# Patient Record
Sex: Male | Born: 1935 | Race: White | Hispanic: No | Marital: Married | Smoking: Former smoker
Health system: Southern US, Community
[De-identification: ages and names within clinical notes are randomized; demographics above are authoritative.]

## PROBLEM LIST (undated history)

## (undated) DIAGNOSIS — J45909 Unspecified asthma, uncomplicated: Secondary | ICD-10-CM

## (undated) DIAGNOSIS — M1990 Unspecified osteoarthritis, unspecified site: Secondary | ICD-10-CM

## (undated) DIAGNOSIS — I82409 Acute embolism and thrombosis of unspecified deep veins of unspecified lower extremity: Secondary | ICD-10-CM

## (undated) DIAGNOSIS — I10 Essential (primary) hypertension: Secondary | ICD-10-CM

## (undated) DIAGNOSIS — E119 Type 2 diabetes mellitus without complications: Secondary | ICD-10-CM

## (undated) DIAGNOSIS — E785 Hyperlipidemia, unspecified: Secondary | ICD-10-CM

## (undated) HISTORY — DX: Hyperlipidemia, unspecified: E78.5

## (undated) HISTORY — DX: Type 2 diabetes mellitus without complications: E11.9

## (undated) HISTORY — DX: Essential (primary) hypertension: I10

## (undated) HISTORY — PX: CHOLECYSTECTOMY: SHX55

## (undated) HISTORY — PX: TRANSURETHRAL RESECTION OF PROSTATE: SHX73

---

## 2020-03-05 DIAGNOSIS — E1129 Type 2 diabetes mellitus with other diabetic kidney complication: Secondary | ICD-10-CM | POA: Diagnosis not present

## 2020-03-05 DIAGNOSIS — R809 Proteinuria, unspecified: Secondary | ICD-10-CM | POA: Diagnosis not present

## 2020-03-05 DIAGNOSIS — I10 Essential (primary) hypertension: Secondary | ICD-10-CM | POA: Diagnosis not present

## 2020-03-05 DIAGNOSIS — E1165 Type 2 diabetes mellitus with hyperglycemia: Secondary | ICD-10-CM | POA: Diagnosis not present

## 2020-03-05 DIAGNOSIS — Z299 Encounter for prophylactic measures, unspecified: Secondary | ICD-10-CM | POA: Diagnosis not present

## 2020-03-05 DIAGNOSIS — Z87891 Personal history of nicotine dependence: Secondary | ICD-10-CM | POA: Diagnosis not present

## 2020-03-10 DIAGNOSIS — E1140 Type 2 diabetes mellitus with diabetic neuropathy, unspecified: Secondary | ICD-10-CM | POA: Diagnosis not present

## 2020-03-10 DIAGNOSIS — M79605 Pain in left leg: Secondary | ICD-10-CM | POA: Diagnosis not present

## 2020-03-10 DIAGNOSIS — E1143 Type 2 diabetes mellitus with diabetic autonomic (poly)neuropathy: Secondary | ICD-10-CM | POA: Diagnosis not present

## 2020-03-10 DIAGNOSIS — M79604 Pain in right leg: Secondary | ICD-10-CM | POA: Diagnosis not present

## 2020-03-25 DIAGNOSIS — I10 Essential (primary) hypertension: Secondary | ICD-10-CM | POA: Diagnosis not present

## 2020-03-25 DIAGNOSIS — E1165 Type 2 diabetes mellitus with hyperglycemia: Secondary | ICD-10-CM | POA: Diagnosis not present

## 2020-03-25 DIAGNOSIS — Z299 Encounter for prophylactic measures, unspecified: Secondary | ICD-10-CM | POA: Diagnosis not present

## 2020-03-25 DIAGNOSIS — W57XXXA Bitten or stung by nonvenomous insect and other nonvenomous arthropods, initial encounter: Secondary | ICD-10-CM | POA: Diagnosis not present

## 2020-04-08 DIAGNOSIS — I82409 Acute embolism and thrombosis of unspecified deep veins of unspecified lower extremity: Secondary | ICD-10-CM | POA: Diagnosis not present

## 2020-04-08 DIAGNOSIS — I10 Essential (primary) hypertension: Secondary | ICD-10-CM | POA: Diagnosis not present

## 2020-04-08 DIAGNOSIS — Z299 Encounter for prophylactic measures, unspecified: Secondary | ICD-10-CM | POA: Diagnosis not present

## 2020-04-08 DIAGNOSIS — E1165 Type 2 diabetes mellitus with hyperglycemia: Secondary | ICD-10-CM | POA: Diagnosis not present

## 2020-04-08 DIAGNOSIS — W57XXXA Bitten or stung by nonvenomous insect and other nonvenomous arthropods, initial encounter: Secondary | ICD-10-CM | POA: Diagnosis not present

## 2020-04-20 DIAGNOSIS — Z299 Encounter for prophylactic measures, unspecified: Secondary | ICD-10-CM | POA: Diagnosis not present

## 2020-04-20 DIAGNOSIS — E1165 Type 2 diabetes mellitus with hyperglycemia: Secondary | ICD-10-CM | POA: Diagnosis not present

## 2020-04-20 DIAGNOSIS — I10 Essential (primary) hypertension: Secondary | ICD-10-CM | POA: Diagnosis not present

## 2020-04-20 DIAGNOSIS — I82409 Acute embolism and thrombosis of unspecified deep veins of unspecified lower extremity: Secondary | ICD-10-CM | POA: Diagnosis not present

## 2020-05-24 DIAGNOSIS — E1165 Type 2 diabetes mellitus with hyperglycemia: Secondary | ICD-10-CM | POA: Diagnosis not present

## 2020-05-24 DIAGNOSIS — E119 Type 2 diabetes mellitus without complications: Secondary | ICD-10-CM | POA: Diagnosis not present

## 2020-06-01 DIAGNOSIS — I82409 Acute embolism and thrombosis of unspecified deep veins of unspecified lower extremity: Secondary | ICD-10-CM | POA: Diagnosis not present

## 2020-06-01 DIAGNOSIS — E1165 Type 2 diabetes mellitus with hyperglycemia: Secondary | ICD-10-CM | POA: Diagnosis not present

## 2020-06-01 DIAGNOSIS — I10 Essential (primary) hypertension: Secondary | ICD-10-CM | POA: Diagnosis not present

## 2020-06-01 DIAGNOSIS — Z299 Encounter for prophylactic measures, unspecified: Secondary | ICD-10-CM | POA: Diagnosis not present

## 2020-06-09 DIAGNOSIS — M79604 Pain in right leg: Secondary | ICD-10-CM | POA: Diagnosis not present

## 2020-06-09 DIAGNOSIS — M79605 Pain in left leg: Secondary | ICD-10-CM | POA: Diagnosis not present

## 2020-06-09 DIAGNOSIS — E1140 Type 2 diabetes mellitus with diabetic neuropathy, unspecified: Secondary | ICD-10-CM | POA: Diagnosis not present

## 2020-06-18 DIAGNOSIS — E1165 Type 2 diabetes mellitus with hyperglycemia: Secondary | ICD-10-CM | POA: Diagnosis not present

## 2020-06-18 DIAGNOSIS — Z299 Encounter for prophylactic measures, unspecified: Secondary | ICD-10-CM | POA: Diagnosis not present

## 2020-06-18 DIAGNOSIS — J069 Acute upper respiratory infection, unspecified: Secondary | ICD-10-CM | POA: Diagnosis not present

## 2020-06-18 DIAGNOSIS — I82409 Acute embolism and thrombosis of unspecified deep veins of unspecified lower extremity: Secondary | ICD-10-CM | POA: Diagnosis not present

## 2020-06-18 DIAGNOSIS — I10 Essential (primary) hypertension: Secondary | ICD-10-CM | POA: Diagnosis not present

## 2020-06-24 DIAGNOSIS — Z7189 Other specified counseling: Secondary | ICD-10-CM | POA: Diagnosis not present

## 2020-06-24 DIAGNOSIS — Z1339 Encounter for screening examination for other mental health and behavioral disorders: Secondary | ICD-10-CM | POA: Diagnosis not present

## 2020-06-24 DIAGNOSIS — E7800 Pure hypercholesterolemia, unspecified: Secondary | ICD-10-CM | POA: Diagnosis not present

## 2020-06-24 DIAGNOSIS — Z1331 Encounter for screening for depression: Secondary | ICD-10-CM | POA: Diagnosis not present

## 2020-06-24 DIAGNOSIS — E119 Type 2 diabetes mellitus without complications: Secondary | ICD-10-CM | POA: Diagnosis not present

## 2020-06-24 DIAGNOSIS — Z0000 Encounter for general adult medical examination without abnormal findings: Secondary | ICD-10-CM | POA: Diagnosis not present

## 2020-06-24 DIAGNOSIS — E1165 Type 2 diabetes mellitus with hyperglycemia: Secondary | ICD-10-CM | POA: Diagnosis not present

## 2020-06-24 DIAGNOSIS — Z299 Encounter for prophylactic measures, unspecified: Secondary | ICD-10-CM | POA: Diagnosis not present

## 2020-06-24 DIAGNOSIS — Z1211 Encounter for screening for malignant neoplasm of colon: Secondary | ICD-10-CM | POA: Diagnosis not present

## 2020-06-24 DIAGNOSIS — R5383 Other fatigue: Secondary | ICD-10-CM | POA: Diagnosis not present

## 2020-06-24 DIAGNOSIS — Z125 Encounter for screening for malignant neoplasm of prostate: Secondary | ICD-10-CM | POA: Diagnosis not present

## 2020-06-24 DIAGNOSIS — Z79899 Other long term (current) drug therapy: Secondary | ICD-10-CM | POA: Diagnosis not present

## 2020-06-24 DIAGNOSIS — Z6825 Body mass index (BMI) 25.0-25.9, adult: Secondary | ICD-10-CM | POA: Diagnosis not present

## 2020-07-24 DIAGNOSIS — E119 Type 2 diabetes mellitus without complications: Secondary | ICD-10-CM | POA: Diagnosis not present

## 2020-07-24 DIAGNOSIS — E1165 Type 2 diabetes mellitus with hyperglycemia: Secondary | ICD-10-CM | POA: Diagnosis not present

## 2020-07-28 DIAGNOSIS — Z299 Encounter for prophylactic measures, unspecified: Secondary | ICD-10-CM | POA: Diagnosis not present

## 2020-07-28 DIAGNOSIS — E1165 Type 2 diabetes mellitus with hyperglycemia: Secondary | ICD-10-CM | POA: Diagnosis not present

## 2020-07-28 DIAGNOSIS — I10 Essential (primary) hypertension: Secondary | ICD-10-CM | POA: Diagnosis not present

## 2020-07-28 DIAGNOSIS — I82409 Acute embolism and thrombosis of unspecified deep veins of unspecified lower extremity: Secondary | ICD-10-CM | POA: Diagnosis not present

## 2020-07-28 DIAGNOSIS — I272 Pulmonary hypertension, unspecified: Secondary | ICD-10-CM | POA: Diagnosis not present

## 2020-07-28 DIAGNOSIS — Z6825 Body mass index (BMI) 25.0-25.9, adult: Secondary | ICD-10-CM | POA: Diagnosis not present

## 2020-08-25 DIAGNOSIS — E119 Type 2 diabetes mellitus without complications: Secondary | ICD-10-CM | POA: Diagnosis not present

## 2020-08-25 DIAGNOSIS — E1165 Type 2 diabetes mellitus with hyperglycemia: Secondary | ICD-10-CM | POA: Diagnosis not present

## 2020-09-24 DIAGNOSIS — E119 Type 2 diabetes mellitus without complications: Secondary | ICD-10-CM | POA: Diagnosis not present

## 2020-09-24 DIAGNOSIS — E1165 Type 2 diabetes mellitus with hyperglycemia: Secondary | ICD-10-CM | POA: Diagnosis not present

## 2020-10-11 DIAGNOSIS — U071 COVID-19: Secondary | ICD-10-CM | POA: Diagnosis not present

## 2020-10-11 DIAGNOSIS — Z20822 Contact with and (suspected) exposure to covid-19: Secondary | ICD-10-CM | POA: Diagnosis not present

## 2020-10-11 DIAGNOSIS — J209 Acute bronchitis, unspecified: Secondary | ICD-10-CM | POA: Diagnosis not present

## 2020-10-11 DIAGNOSIS — I700 Atherosclerosis of aorta: Secondary | ICD-10-CM | POA: Diagnosis not present

## 2020-10-11 DIAGNOSIS — E119 Type 2 diabetes mellitus without complications: Secondary | ICD-10-CM | POA: Diagnosis not present

## 2020-10-11 DIAGNOSIS — R0602 Shortness of breath: Secondary | ICD-10-CM | POA: Diagnosis not present

## 2020-10-11 DIAGNOSIS — R000 Tachycardia, unspecified: Secondary | ICD-10-CM | POA: Diagnosis not present

## 2020-10-11 DIAGNOSIS — R079 Chest pain, unspecified: Secondary | ICD-10-CM | POA: Diagnosis not present

## 2020-10-11 DIAGNOSIS — J9811 Atelectasis: Secondary | ICD-10-CM | POA: Diagnosis not present

## 2020-10-11 DIAGNOSIS — R059 Cough, unspecified: Secondary | ICD-10-CM | POA: Diagnosis not present

## 2020-10-11 DIAGNOSIS — I4510 Unspecified right bundle-branch block: Secondary | ICD-10-CM | POA: Diagnosis not present

## 2020-10-14 DIAGNOSIS — I10 Essential (primary) hypertension: Secondary | ICD-10-CM | POA: Diagnosis not present

## 2020-10-14 DIAGNOSIS — Z299 Encounter for prophylactic measures, unspecified: Secondary | ICD-10-CM | POA: Diagnosis not present

## 2020-10-14 DIAGNOSIS — U071 COVID-19: Secondary | ICD-10-CM | POA: Diagnosis not present

## 2020-10-14 DIAGNOSIS — E1165 Type 2 diabetes mellitus with hyperglycemia: Secondary | ICD-10-CM | POA: Diagnosis not present

## 2020-10-14 DIAGNOSIS — I82409 Acute embolism and thrombosis of unspecified deep veins of unspecified lower extremity: Secondary | ICD-10-CM | POA: Diagnosis not present

## 2020-10-24 DIAGNOSIS — E1165 Type 2 diabetes mellitus with hyperglycemia: Secondary | ICD-10-CM | POA: Diagnosis not present

## 2020-10-24 DIAGNOSIS — E119 Type 2 diabetes mellitus without complications: Secondary | ICD-10-CM | POA: Diagnosis not present

## 2020-11-03 DIAGNOSIS — I82409 Acute embolism and thrombosis of unspecified deep veins of unspecified lower extremity: Secondary | ICD-10-CM | POA: Diagnosis not present

## 2020-11-03 DIAGNOSIS — Z23 Encounter for immunization: Secondary | ICD-10-CM | POA: Diagnosis not present

## 2020-11-03 DIAGNOSIS — E1142 Type 2 diabetes mellitus with diabetic polyneuropathy: Secondary | ICD-10-CM | POA: Diagnosis not present

## 2020-11-03 DIAGNOSIS — M79676 Pain in unspecified toe(s): Secondary | ICD-10-CM | POA: Diagnosis not present

## 2020-11-03 DIAGNOSIS — Z299 Encounter for prophylactic measures, unspecified: Secondary | ICD-10-CM | POA: Diagnosis not present

## 2020-11-03 DIAGNOSIS — I10 Essential (primary) hypertension: Secondary | ICD-10-CM | POA: Diagnosis not present

## 2020-11-03 DIAGNOSIS — L84 Corns and callosities: Secondary | ICD-10-CM | POA: Diagnosis not present

## 2020-11-03 DIAGNOSIS — B351 Tinea unguium: Secondary | ICD-10-CM | POA: Diagnosis not present

## 2020-11-03 DIAGNOSIS — E1165 Type 2 diabetes mellitus with hyperglycemia: Secondary | ICD-10-CM | POA: Diagnosis not present

## 2021-02-02 DIAGNOSIS — L84 Corns and callosities: Secondary | ICD-10-CM | POA: Diagnosis not present

## 2021-02-02 DIAGNOSIS — B351 Tinea unguium: Secondary | ICD-10-CM | POA: Diagnosis not present

## 2021-02-02 DIAGNOSIS — E1142 Type 2 diabetes mellitus with diabetic polyneuropathy: Secondary | ICD-10-CM | POA: Diagnosis not present

## 2021-02-02 DIAGNOSIS — M79676 Pain in unspecified toe(s): Secondary | ICD-10-CM | POA: Diagnosis not present

## 2021-02-10 DIAGNOSIS — H34231 Retinal artery branch occlusion, right eye: Secondary | ICD-10-CM | POA: Diagnosis not present

## 2021-02-11 DIAGNOSIS — Z6826 Body mass index (BMI) 26.0-26.9, adult: Secondary | ICD-10-CM | POA: Diagnosis not present

## 2021-02-11 DIAGNOSIS — Z299 Encounter for prophylactic measures, unspecified: Secondary | ICD-10-CM | POA: Diagnosis not present

## 2021-02-11 DIAGNOSIS — I10 Essential (primary) hypertension: Secondary | ICD-10-CM | POA: Diagnosis not present

## 2021-02-11 DIAGNOSIS — Z794 Long term (current) use of insulin: Secondary | ICD-10-CM | POA: Diagnosis not present

## 2021-02-11 DIAGNOSIS — E1165 Type 2 diabetes mellitus with hyperglycemia: Secondary | ICD-10-CM | POA: Diagnosis not present

## 2021-02-11 DIAGNOSIS — Z87891 Personal history of nicotine dependence: Secondary | ICD-10-CM | POA: Diagnosis not present

## 2021-02-11 DIAGNOSIS — H34239 Retinal artery branch occlusion, unspecified eye: Secondary | ICD-10-CM | POA: Diagnosis not present

## 2021-02-11 DIAGNOSIS — G459 Transient cerebral ischemic attack, unspecified: Secondary | ICD-10-CM | POA: Diagnosis not present

## 2021-03-01 DIAGNOSIS — G459 Transient cerebral ischemic attack, unspecified: Secondary | ICD-10-CM | POA: Diagnosis not present

## 2021-03-05 DIAGNOSIS — E1165 Type 2 diabetes mellitus with hyperglycemia: Secondary | ICD-10-CM | POA: Diagnosis not present

## 2021-03-05 DIAGNOSIS — G459 Transient cerebral ischemic attack, unspecified: Secondary | ICD-10-CM | POA: Diagnosis not present

## 2021-03-05 DIAGNOSIS — I10 Essential (primary) hypertension: Secondary | ICD-10-CM | POA: Diagnosis not present

## 2021-03-05 DIAGNOSIS — Z299 Encounter for prophylactic measures, unspecified: Secondary | ICD-10-CM | POA: Diagnosis not present

## 2021-03-05 DIAGNOSIS — I272 Pulmonary hypertension, unspecified: Secondary | ICD-10-CM | POA: Diagnosis not present

## 2021-04-06 DIAGNOSIS — L819 Disorder of pigmentation, unspecified: Secondary | ICD-10-CM | POA: Diagnosis not present

## 2021-04-06 DIAGNOSIS — I272 Pulmonary hypertension, unspecified: Secondary | ICD-10-CM | POA: Diagnosis not present

## 2021-04-06 DIAGNOSIS — E1165 Type 2 diabetes mellitus with hyperglycemia: Secondary | ICD-10-CM | POA: Diagnosis not present

## 2021-04-06 DIAGNOSIS — I10 Essential (primary) hypertension: Secondary | ICD-10-CM | POA: Diagnosis not present

## 2021-04-06 DIAGNOSIS — Z299 Encounter for prophylactic measures, unspecified: Secondary | ICD-10-CM | POA: Diagnosis not present

## 2021-04-26 DIAGNOSIS — L819 Disorder of pigmentation, unspecified: Secondary | ICD-10-CM | POA: Diagnosis not present

## 2021-05-04 DIAGNOSIS — Z299 Encounter for prophylactic measures, unspecified: Secondary | ICD-10-CM | POA: Diagnosis not present

## 2021-05-04 DIAGNOSIS — I739 Peripheral vascular disease, unspecified: Secondary | ICD-10-CM | POA: Diagnosis not present

## 2021-05-04 DIAGNOSIS — I10 Essential (primary) hypertension: Secondary | ICD-10-CM | POA: Diagnosis not present

## 2021-05-04 DIAGNOSIS — I272 Pulmonary hypertension, unspecified: Secondary | ICD-10-CM | POA: Diagnosis not present

## 2021-05-04 DIAGNOSIS — E1165 Type 2 diabetes mellitus with hyperglycemia: Secondary | ICD-10-CM | POA: Diagnosis not present

## 2021-05-18 DIAGNOSIS — B351 Tinea unguium: Secondary | ICD-10-CM | POA: Diagnosis not present

## 2021-05-18 DIAGNOSIS — E1142 Type 2 diabetes mellitus with diabetic polyneuropathy: Secondary | ICD-10-CM | POA: Diagnosis not present

## 2021-05-18 DIAGNOSIS — L84 Corns and callosities: Secondary | ICD-10-CM | POA: Diagnosis not present

## 2021-05-18 DIAGNOSIS — M79676 Pain in unspecified toe(s): Secondary | ICD-10-CM | POA: Diagnosis not present

## 2021-05-26 DIAGNOSIS — H34231 Retinal artery branch occlusion, right eye: Secondary | ICD-10-CM | POA: Diagnosis not present

## 2021-05-26 DIAGNOSIS — I639 Cerebral infarction, unspecified: Secondary | ICD-10-CM

## 2021-05-26 DIAGNOSIS — H469 Unspecified optic neuritis: Secondary | ICD-10-CM | POA: Diagnosis not present

## 2021-05-26 HISTORY — DX: Cerebral infarction, unspecified: I63.9

## 2021-05-27 DIAGNOSIS — I739 Peripheral vascular disease, unspecified: Secondary | ICD-10-CM | POA: Diagnosis not present

## 2021-06-11 DIAGNOSIS — H469 Unspecified optic neuritis: Secondary | ICD-10-CM | POA: Diagnosis not present

## 2021-06-11 DIAGNOSIS — H401132 Primary open-angle glaucoma, bilateral, moderate stage: Secondary | ICD-10-CM | POA: Diagnosis not present

## 2021-06-15 DIAGNOSIS — H547 Unspecified visual loss: Secondary | ICD-10-CM | POA: Diagnosis not present

## 2021-06-15 DIAGNOSIS — H5461 Unqualified visual loss, right eye, normal vision left eye: Secondary | ICD-10-CM | POA: Diagnosis not present

## 2021-06-15 DIAGNOSIS — E119 Type 2 diabetes mellitus without complications: Secondary | ICD-10-CM | POA: Diagnosis not present

## 2021-06-15 DIAGNOSIS — I10 Essential (primary) hypertension: Secondary | ICD-10-CM | POA: Diagnosis not present

## 2021-06-15 DIAGNOSIS — E785 Hyperlipidemia, unspecified: Secondary | ICD-10-CM | POA: Diagnosis not present

## 2021-06-16 DIAGNOSIS — G459 Transient cerebral ischemic attack, unspecified: Secondary | ICD-10-CM | POA: Diagnosis not present

## 2021-06-16 DIAGNOSIS — Z299 Encounter for prophylactic measures, unspecified: Secondary | ICD-10-CM | POA: Diagnosis not present

## 2021-06-16 DIAGNOSIS — E1165 Type 2 diabetes mellitus with hyperglycemia: Secondary | ICD-10-CM | POA: Diagnosis not present

## 2021-06-16 DIAGNOSIS — I10 Essential (primary) hypertension: Secondary | ICD-10-CM | POA: Diagnosis not present

## 2021-06-23 DIAGNOSIS — G459 Transient cerebral ischemic attack, unspecified: Secondary | ICD-10-CM | POA: Diagnosis not present

## 2021-06-23 DIAGNOSIS — I639 Cerebral infarction, unspecified: Secondary | ICD-10-CM | POA: Diagnosis not present

## 2021-06-23 DIAGNOSIS — R930 Abnormal findings on diagnostic imaging of skull and head, not elsewhere classified: Secondary | ICD-10-CM | POA: Diagnosis not present

## 2021-06-25 DIAGNOSIS — Z79899 Other long term (current) drug therapy: Secondary | ICD-10-CM | POA: Diagnosis not present

## 2021-06-25 DIAGNOSIS — Z0000 Encounter for general adult medical examination without abnormal findings: Secondary | ICD-10-CM | POA: Diagnosis not present

## 2021-06-25 DIAGNOSIS — Z6825 Body mass index (BMI) 25.0-25.9, adult: Secondary | ICD-10-CM | POA: Diagnosis not present

## 2021-06-25 DIAGNOSIS — E7800 Pure hypercholesterolemia, unspecified: Secondary | ICD-10-CM | POA: Diagnosis not present

## 2021-06-25 DIAGNOSIS — E236 Other disorders of pituitary gland: Secondary | ICD-10-CM | POA: Diagnosis not present

## 2021-06-25 DIAGNOSIS — R5383 Other fatigue: Secondary | ICD-10-CM | POA: Diagnosis not present

## 2021-06-25 DIAGNOSIS — Z1331 Encounter for screening for depression: Secondary | ICD-10-CM | POA: Diagnosis not present

## 2021-06-25 DIAGNOSIS — Z1339 Encounter for screening examination for other mental health and behavioral disorders: Secondary | ICD-10-CM | POA: Diagnosis not present

## 2021-06-25 DIAGNOSIS — Z125 Encounter for screening for malignant neoplasm of prostate: Secondary | ICD-10-CM | POA: Diagnosis not present

## 2021-06-25 DIAGNOSIS — Z299 Encounter for prophylactic measures, unspecified: Secondary | ICD-10-CM | POA: Diagnosis not present

## 2021-06-25 DIAGNOSIS — Z7189 Other specified counseling: Secondary | ICD-10-CM | POA: Diagnosis not present

## 2021-06-30 DIAGNOSIS — D496 Neoplasm of unspecified behavior of brain: Secondary | ICD-10-CM | POA: Diagnosis not present

## 2021-06-30 DIAGNOSIS — I10 Essential (primary) hypertension: Secondary | ICD-10-CM | POA: Diagnosis not present

## 2021-07-08 ENCOUNTER — Other Ambulatory Visit (HOSPITAL_COMMUNITY): Payer: Self-pay | Admitting: Neurosurgery

## 2021-07-08 DIAGNOSIS — D496 Neoplasm of unspecified behavior of brain: Secondary | ICD-10-CM

## 2021-07-22 ENCOUNTER — Ambulatory Visit (HOSPITAL_COMMUNITY): Admission: RE | Admit: 2021-07-22 | Payer: Medicare PPO | Source: Ambulatory Visit

## 2021-07-22 ENCOUNTER — Encounter (HOSPITAL_COMMUNITY): Payer: Self-pay

## 2021-08-03 ENCOUNTER — Ambulatory Visit (HOSPITAL_COMMUNITY)
Admission: RE | Admit: 2021-08-03 | Discharge: 2021-08-03 | Disposition: A | Discharge status: Home / Self Care | Payer: Medicare PPO | Source: Ambulatory Visit | Attending: Neurosurgery | Admitting: Neurosurgery

## 2021-08-03 ENCOUNTER — Other Ambulatory Visit: Payer: Self-pay

## 2021-08-03 DIAGNOSIS — D496 Neoplasm of unspecified behavior of brain: Secondary | ICD-10-CM | POA: Insufficient documentation

## 2021-08-03 DIAGNOSIS — R9089 Other abnormal findings on diagnostic imaging of central nervous system: Secondary | ICD-10-CM | POA: Diagnosis not present

## 2021-08-03 DIAGNOSIS — D320 Benign neoplasm of cerebral meninges: Secondary | ICD-10-CM | POA: Insufficient documentation

## 2021-08-03 DIAGNOSIS — H5461 Unqualified visual loss, right eye, normal vision left eye: Secondary | ICD-10-CM | POA: Insufficient documentation

## 2021-08-03 DIAGNOSIS — H47091 Other disorders of optic nerve, not elsewhere classified, right eye: Secondary | ICD-10-CM | POA: Insufficient documentation

## 2021-08-03 DIAGNOSIS — D329 Benign neoplasm of meninges, unspecified: Secondary | ICD-10-CM | POA: Diagnosis not present

## 2021-08-03 IMAGING — MR MR ORBITS WO/W CM
7 of 11 series · 29 of 48 positions shown · IV contrast (7 ml Gadavist)
Comparison: MRI brain [DATE]

CLINICAL DATA: Right eye vision loss, abnormal MRI brain

EXAM:
MRI OF THE ORBITS WITHOUT AND WITH CONTRAST
TECHNIQUE: Multiplanar, multi-echo pulse sequences of the orbits and
surrounding structures were acquired including fat saturation
techniques, before and after intravenous contrast administration.
CONTRAST:  7mL GADAVIST GADOBUTROL 1 MMOL/ML IV SOLN

[Series 5: T1 · sagittal · 3.0mm · 0.39mm/px · 5 of 37 slices shown]
[im 1/37]
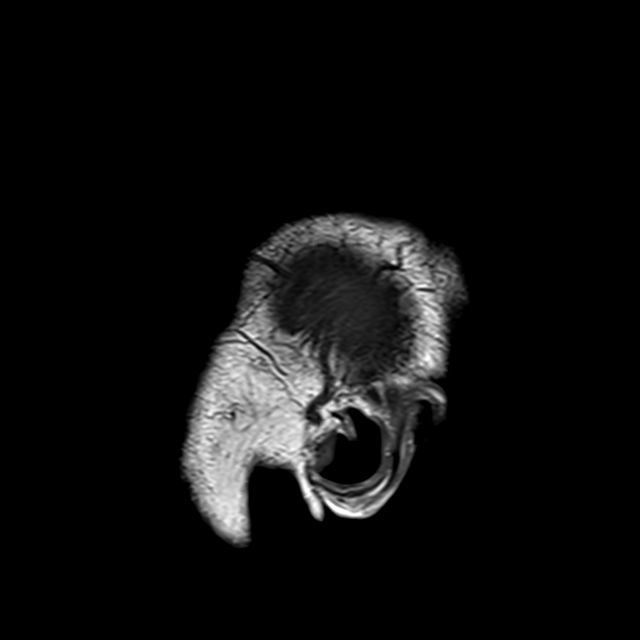
[im 7/37]
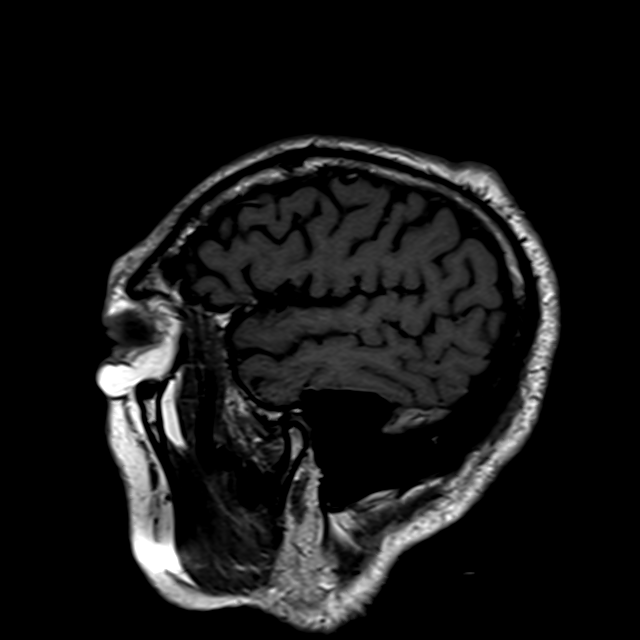
[im 13/37]
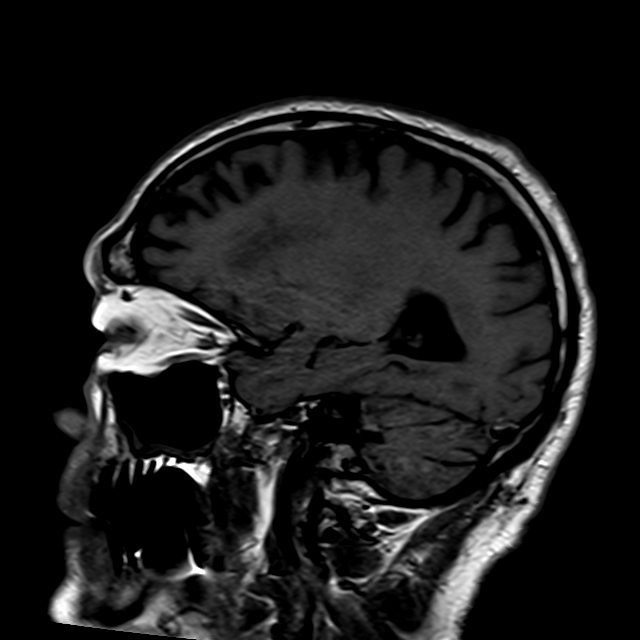
[im 19/37]
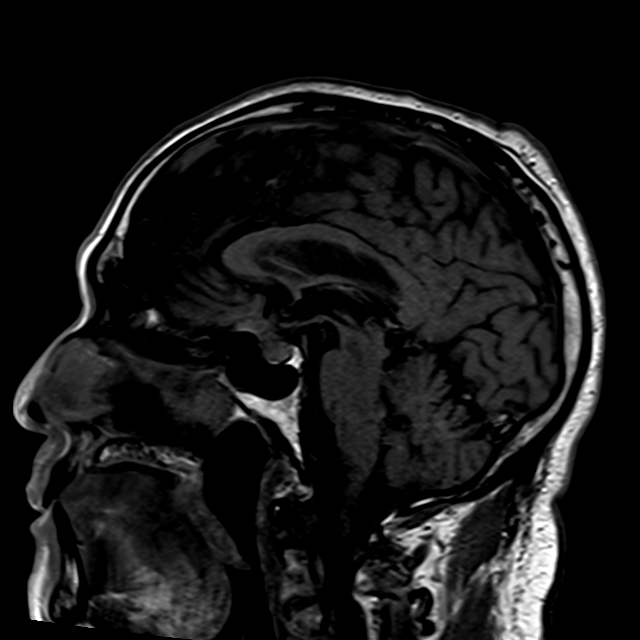
[im 25/37]
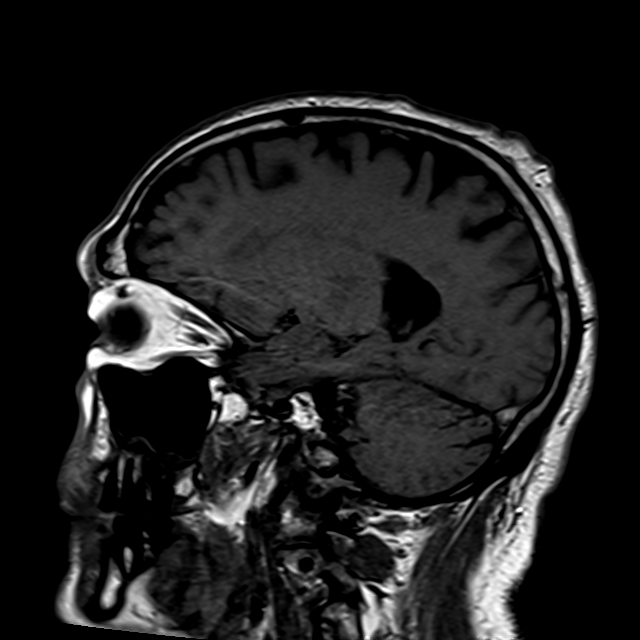

[Series 7: T2 fat-sat · axial · 3.0mm · 0.54mm/px · z∈[-73,+28]mm · 4 of 25 slices shown (1 of 6)]
[im 1/25]
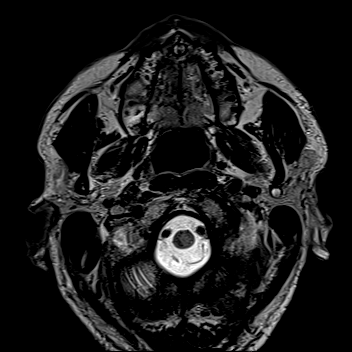
[im 9/25]
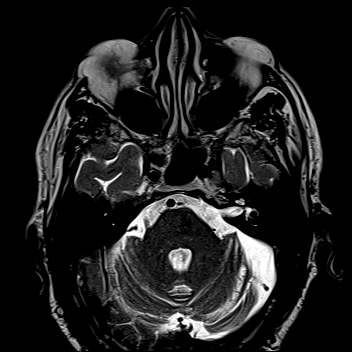
[im 17/25]
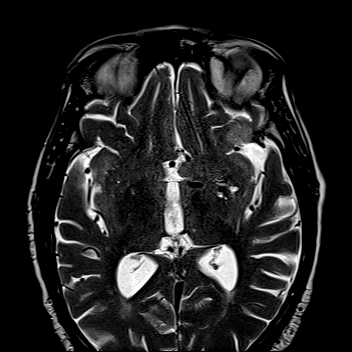
[im 25/25]
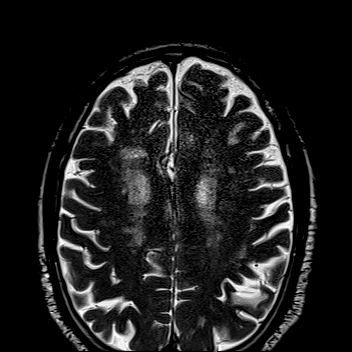

[Series 8: T2 fat-sat · axial · 3.0mm · 0.54mm/px · z∈[-73,+28]mm · 4 of 25 slices shown (2 of 6)]
[im 1/25]
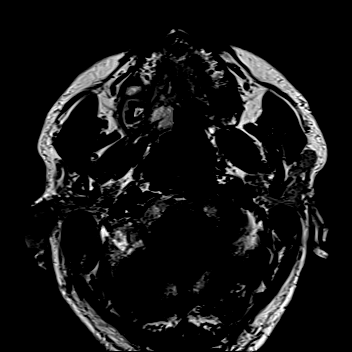
[im 9/25]
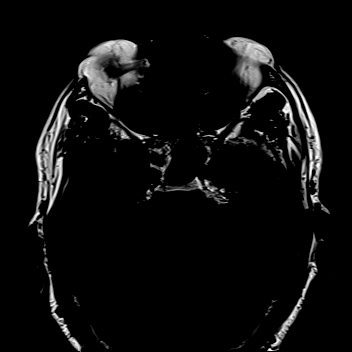
[im 17/25]
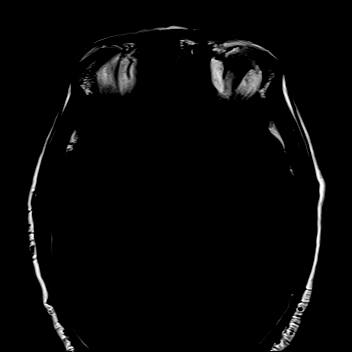
[im 25/25]
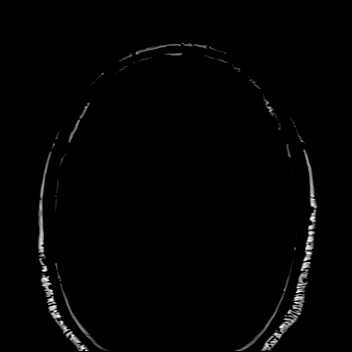

[Series 9: T2 fat-sat · axial · 3.0mm · 0.54mm/px · z∈[-73,+28]mm · 4 of 25 slices shown (3 of 6)]
[im 1/25]
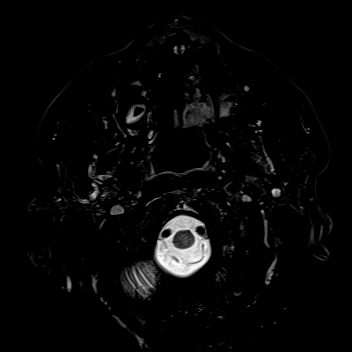
[im 9/25]
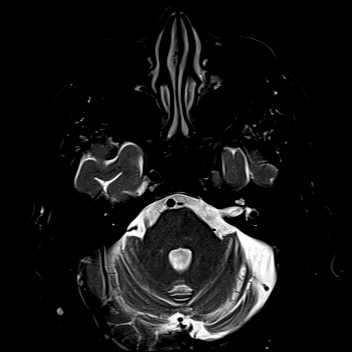
[im 17/25]
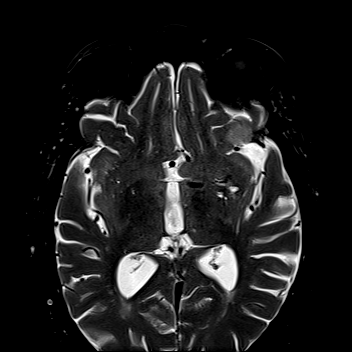
[im 25/25]
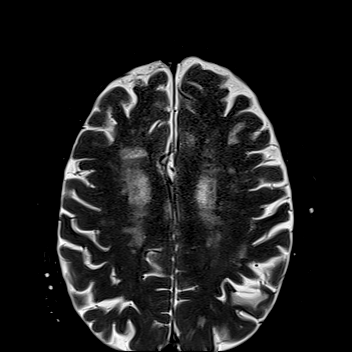

[Series 10: T2 fat-sat · coronal · 3.0mm · 0.54mm/px · 4 of 25 slices shown (4 of 6)]
[im 1/25]
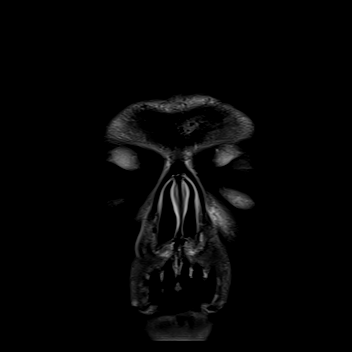
[im 9/25]
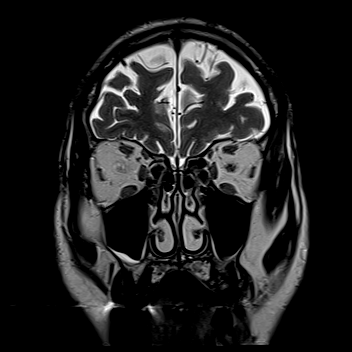
[im 17/25]
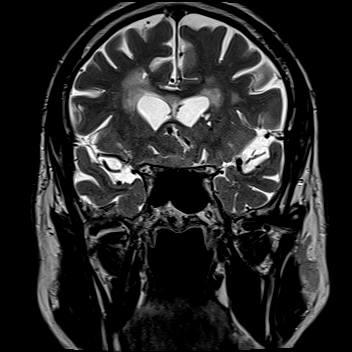
[im 25/25]
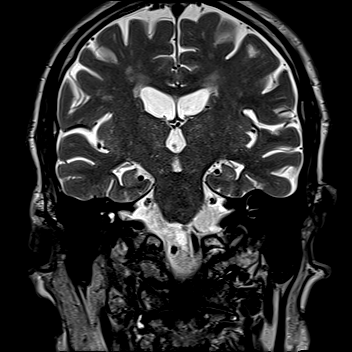

[Series 11: T2 fat-sat · coronal · 3.0mm · 0.54mm/px · 4 of 25 slices shown (5 of 6)]
[im 1/25]
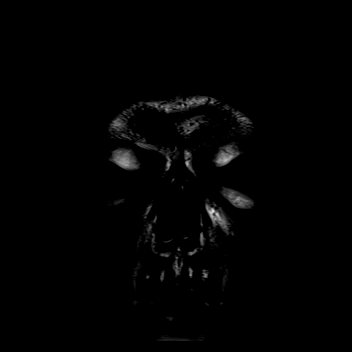
[im 9/25]
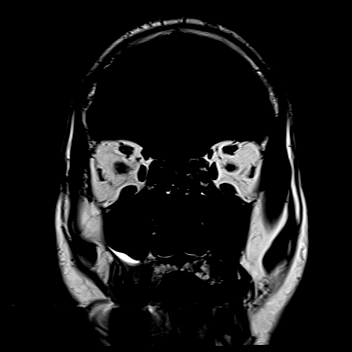
[im 17/25]
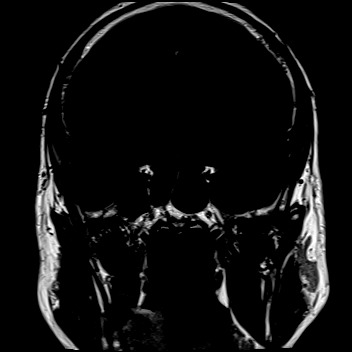
[im 25/25]
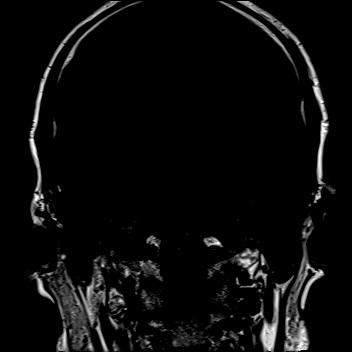

[Series 12: T2 fat-sat · coronal · 3.0mm · 0.54mm/px · 4 of 25 slices shown (6 of 6)]
[im 1/25]
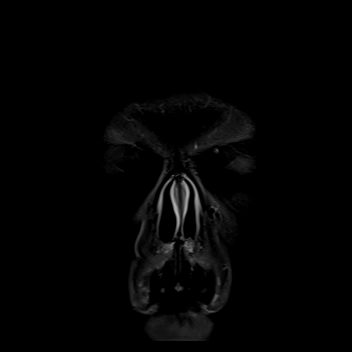
[im 9/25]
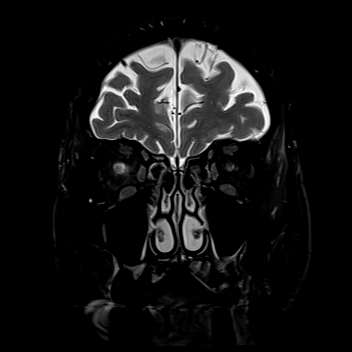
[im 17/25]
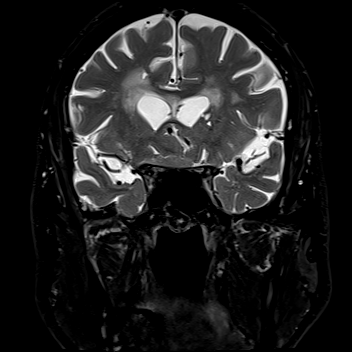
[im 25/25]
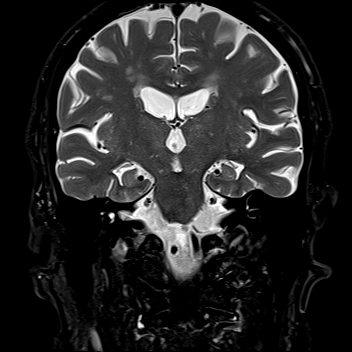

[29 of 48 positions shown; findings below may reference images not displayed]

FINDINGS: Orbits: No proptosis. No intraorbital mass. Bilateral lens
replacements. Extraocular muscles and lacrimal glands are symmetric
and unremarkable. There is asymmetric enhancement along the right
optic nerve sheath. No abnormal enhancement of the optic nerves. The
right cisternal optic nerve may be compressed by mass described
below.

Visualized sinuses: Minor mucosal thickening.

Soft tissues: Unremarkable.

Limited intracranial: Enhancing extra-axial, likely dural-based mass
along the planum sphenoidale extending into the right suprasellar
cistern and right aspect of the sella. Likely involvement of the
orbital apex. Possible partial involvement of the right cavernous
sinus. Approximate measurement of 1.8 x 2.7 x 1.5 cm. There is an
additional extra-axial lesion along the left greater sphenoid wing
measuring approximately 1.3 x 1.2 x 0.5 cm (series 100, image 10).
IMPRESSION: Sellar/planum sphenoidale meningioma with likely involvement of the
orbital apex and possible partial involvement of the right cavernous
sinus. No parenchymal edema. Right cisternal optic nerve may be
compressed. There is asymmetric enhancement of the right optic nerve
sheath suggesting involvement.

Additional smaller meningioma along the left greater sphenoid wing.

## 2021-08-03 MED ORDER — GADOBUTROL 1 MMOL/ML IV SOLN
7.0000 mL | Freq: Once | INTRAVENOUS | Status: AC | PRN
  Administered 2021-08-03: 7 mL via INTRAVENOUS

## 2021-08-05 ENCOUNTER — Encounter: Payer: Self-pay | Admitting: "Endocrinology

## 2021-08-05 ENCOUNTER — Other Ambulatory Visit: Payer: Self-pay

## 2021-08-05 ENCOUNTER — Ambulatory Visit: Payer: Medicare PPO | Admitting: "Endocrinology

## 2021-08-05 VITALS — BP 130/52 | HR 56 | Ht 74.0 in | Wt 190.0 lb

## 2021-08-05 DIAGNOSIS — D320 Benign neoplasm of cerebral meninges: Secondary | ICD-10-CM

## 2021-08-05 NOTE — Progress Notes (Signed)
Endocrinology Consult Note                                            08/05/2021, 2:27 PM   Subjective:    Patient ID: Blake Lawson, male    DOB: 1935/09/28, PCP Monico Blitz, MD   Past Medical History:  Diagnosis Date   Diabetes mellitus, type II ([place])    Hyperlipidemia    Hypertension    Past Surgical History:  Procedure Laterality Date   CHOLECYSTECTOMY     TRANSURETHRAL RESECTION OF PROSTATE     Social History   Socioeconomic History   Marital status: Unknown    Spouse name: Not on file   Number of children: Not on file   Years of education: Not on file   Highest education level: Not on file  Occupational History   Not on file  Tobacco Use   Smoking status: Never   Smokeless tobacco: Not on file  Vaping Use   Vaping Use: Never used  Substance and Sexual Activity   Alcohol use: Yes   Drug use: Never   Sexual activity: Not on file  Other Topics Concern   Not on file  Social History Narrative   Not on file   Social Determinants of Health   Financial Resource Strain: Not on file  Food Insecurity: Not on file  Transportation Needs: Not on file  Physical Activity: Not on file  Stress: Not on file  Social Connections: Not on file   Family History  Problem Relation Age of Onset   Hypertension Mother    Diabetes Mother    Diabetes Father    Outpatient Encounter Medications as of 08/05/2021  Medication Sig   albuterol (VENTOLIN HFA) 108 (90 Base) MCG/ACT inhaler Inhale into the lungs.   amLODipine (NORVASC) 5 MG tablet Take 5 mg by mouth daily.   apixaban (ELIQUIS) 5 MG TABS tablet Take 5 mg by mouth 2 (two) times daily.   atorvastatin (LIPITOR) 40 MG tablet Take 40 mg by mouth daily.   empagliflozin (JARDIANCE) 25 MG TABS tablet Take by mouth daily.   insulin aspart protamine- aspart (NOVOLOG MIX 70/30) (70-30) 100 UNIT/ML injection Inject 40 Units into the skin 2 (two) times daily with a meal.   losartan (COZAAR) 100 MG tablet Take 100 mg by  mouth daily.   metFORMIN (GLUCOPHAGE) 500 MG tablet Take by mouth 2 (two) times daily with a meal.   No facility-administered encounter medications on file as of 08/05/2021.   ALLERGIES: No Known Allergies  VACCINATION STATUS:  There is no immunization history on file for this patient.  HPI Blake Lawson is 86 y.o. male who presents today with a medical history as above. he is being seen in consultation for suprasellar mass requested by Monico Blitz, MD.  Patient is accompanied by his daughter to clinic. History is obtained directly from the patient as well as chart review.  At the end of June 2022 he did have abnormal vision with TIA which required work-up with MRI of the head without contrast.  This study showed abnormal right suprasellar soft tissue contiguous with the pituitary suspicious for mass.  Patient denies any prior knowledge of pituitary or brain neoplasm.  Subsequently, at the direction of her neurosurgeon Dr. Consuella Lose he underwent MRI of the orbit with and without contrast.  This study confirmed sellar/planum sphenoidal meningioma with likely involvement of the orbital apex and possible partial involvement of the right cavernous sinus.  The mass was measuring 1.8 x 2.7 x 1.5 cm.  There was an additional extra axial lesion along the left greater sphenoid wing measuring approximately 1.3 x 1.2 x 0.5 cm.  On the second MRI, no mention of whether the pituitary gland is involved. Patient does not have any baseline endocrine work-up. He denies any prior history of thyroid, adrenal, nor gonadal dysfunction. Patient is on treatment for diabetes type 2, hypertension, hyperlipidemia. Patient and his daughter are considering treatment including surgery.  Review of Systems  Constitutional: no recent weight gain/loss, no fatigue, no subjective hyperthermia, no subjective hypothermia Eyes: no blurry vision, no xerophthalmia ENT: no sore throat, no nodules palpated in throat, no  dysphagia/odynophagia, no hoarseness Cardiovascular: no Chest Pain, no Shortness of Breath, no palpitations, no leg swelling Respiratory: no cough, no shortness of breath Gastrointestinal: no Nausea/Vomiting/Diarhhea Musculoskeletal: no muscle/joint aches Skin: no rashes Neurological: no tremors, no numbness, no tingling, no dizziness Psychiatric: no depression, no anxiety  Objective:    Vitals with BMI 08/05/2021  Height '6\' 2"'$   Weight 190 lbs  BMI [id number]  Systolic [id number]  Diastolic 52  Pulse 56    BP (!) 130/52   Pulse (!) 56   Ht '6\' 2"'$  (1.88 m)   Wt 190 lb (86.2 kg)   BMI 24.39 kg/m   Wt Readings from Last 3 Encounters:  08/05/21 190 lb (86.2 kg)    Physical Exam  Constitutional:  Body mass index is 24.39 kg/m.,  not in acute distress, normal state of mind Eyes: PERRLA, EOMI, no exophthalmos ENT: moist mucous membranes, no gross thyromegaly, no gross cervical lymphadenopathy Cardiovascular: normal precordial activity, Regular Rate and Rhythm, no Murmur/Rubs/Gallops Respiratory:  adequate breathing efforts, no gross chest deformity, Clear to auscultation bilaterally Gastrointestinal: abdomen soft, Non -tender, No distension, Bowel Sounds present, no gross organomegaly Musculoskeletal: no gross deformities, strength intact in all four extremities Skin: moist, warm, no rashes Neurological: no tremor with outstretched hands, Deep tendon reflexes normal in bilateral lower extremities.     Assessment & Plan:   1. Meningioma, cerebral [facility])  - Blake Lawson  is being seen at a kind request of Monico Blitz, MD. - I have reviewed his available endocrine records and clinically evaluated the patient. - Based on these reviews, he has suprasellar sphenoidal meningioma with likely involvement of the orbital apex and possible partial involvement of the right cavernous sinus. I had a long discussion with the patient and his daughter that this lesion is likely not originating from  his pituitary gland based on the MRI descriptions. No mention of the pituitary gland involvement.  This patient will need basic work-up including thyroid function, cortisol measurement, CMP, CBC.  He will be considered for hormone replacement if there are any deficits documented.  He is open for and may benefit from neurosurgery. He already saw Dr. Consuella Lose who ordered his second MRI.  I encouraged patient to continue follow-up with him .  I ordered the following labs: Comprehensive metabolic panel - TSH - T4, free - Cortisol-am, blood - CBC   He will return in 5-6 weeks for evaluation by which time he will likely undergo surgery.  - I did not initiate any new prescriptions today. - he is advised to maintain close follow up with Monico Blitz, MD for primary care needs.  - Time spent with the patient:  60 minutes, of which >50% was spent in  counseling him about his sellar sphenoidal meningioma and the rest in obtaining information about his symptoms, reviewing his previous labs/studies ( including abstractions from other facilities),  evaluations, and treatments,  and developing a plan to confirm diagnosis and long term treatment based on the latest standards of care/guidelines; and documenting his care.  Blake Lawson participated in the discussions, expressed understanding, and voiced agreement with the above plans.  All questions were answered to his satisfaction. he is encouraged to contact clinic should he have any questions or concerns prior to his return visit.  Follow up plan: Return in about 6 weeks (around 09/16/2021), or after his surgery.   Blake Lloyd, MD [facility] [address] [place], [place] [address] Phone: [phone]  Fax: [phone]     08/05/2021, 2:27 PM  This note was partially dictated with voice recognition software. Similar sounding words can be transcribed inadequately or may not  be  corrected upon review.

## 2021-08-09 DIAGNOSIS — D320 Benign neoplasm of cerebral meninges: Secondary | ICD-10-CM | POA: Diagnosis not present

## 2021-08-19 DIAGNOSIS — D329 Benign neoplasm of meninges, unspecified: Secondary | ICD-10-CM | POA: Diagnosis not present

## 2021-08-27 DIAGNOSIS — E1165 Type 2 diabetes mellitus with hyperglycemia: Secondary | ICD-10-CM | POA: Diagnosis not present

## 2021-08-27 DIAGNOSIS — D320 Benign neoplasm of cerebral meninges: Secondary | ICD-10-CM | POA: Diagnosis not present

## 2021-08-27 DIAGNOSIS — I10 Essential (primary) hypertension: Secondary | ICD-10-CM | POA: Diagnosis not present

## 2021-08-27 DIAGNOSIS — I82409 Acute embolism and thrombosis of unspecified deep veins of unspecified lower extremity: Secondary | ICD-10-CM | POA: Diagnosis not present

## 2021-08-27 DIAGNOSIS — Z299 Encounter for prophylactic measures, unspecified: Secondary | ICD-10-CM | POA: Diagnosis not present

## 2021-08-31 ENCOUNTER — Other Ambulatory Visit: Payer: Self-pay | Admitting: Neurosurgery

## 2021-09-02 ENCOUNTER — Other Ambulatory Visit: Payer: Self-pay | Admitting: Neurosurgery

## 2021-09-07 ENCOUNTER — Other Ambulatory Visit (HOSPITAL_COMMUNITY): Payer: Medicare PPO

## 2021-09-07 NOTE — Pre-Procedure Instructions (Signed)
,Surgical Instructions    Your procedure is scheduled on Friday, September 10, 2021 at 9:56 AM.  Report to [facility]A" at 8:00 A.M., then check in with the Admitting office.  Call this number if you have problems the morning of surgery:  [phone]   If you have any questions prior to your surgery date call [phone]: Open Monday-Friday 8am-4pm    Remember:  Do not eat or drink after midnight the night before your surgery    Take these medicines the morning of surgery with A SIP OF WATER:  amLODipine (NORVASC) atorvastatin (LIPITOR) metoprolol succinate (TOPROL-XL) albuterol (VENTOLIN HFA) - if needed  Bring albuterol (VENTOLIN HFA) inhaler with you the morning of surgery.  Follow your surgeon's instructions on when to stop apixaban (ELIQUIS).  If no instructions were given by your surgeon then you will need to call the office to get those instructions.     As of today, STOP taking any Aspirin (unless otherwise instructed by your surgeon) Aleve, Naproxen, Ibuprofen, Motrin, Advil, Goody's, BC's, all herbal medications, fish oil, and all vitamins.   WHAT DO I DO ABOUT MY DIABETES MEDICATION?   Do not take metFORMIN (GLUCOPHAGE) the morning of surgery. Do not take empagliflozin (JARDIANCE) the day before surgery (09/09/21) or the day of surgery (09/10/21).  THE NIGHT BEFORE SURGERY, take 17.5-24.5 units of  insulin NPH-regular Human (HUMULIN 70/30), per your sliding scale.      THE MORNING OF SURGERY, DO NOT take your insulin NPH-regular Human (HUMULIN 70/30).  If your CBG is greater than 220 mg/dL, you may take half (12.5-17.5 units) of your insulin NPH-regular Human (HUMULIN 70/30).   HOW TO MANAGE YOUR DIABETES BEFORE AND AFTER SURGERY  Why is it important to control my blood sugar before and after surgery? Improving blood sugar levels before and after surgery helps healing and can limit problems. A way of improving blood sugar control is eating a  healthy diet by:  Eating less sugar and carbohydrates  Increasing activity/exercise  Talking with your doctor about reaching your blood sugar goals High blood sugars (greater than 180 mg/dL) can raise your risk of infections and slow your recovery, so you will need to focus on controlling your diabetes during the weeks before surgery. Make sure that the doctor who takes care of your diabetes knows about your planned surgery including the date and location.  How do I manage my blood sugar before surgery? Check your blood sugar at least 4 times a day, starting 2 days before surgery, to make sure that the level is not too high or low.  Check your blood sugar the morning of your surgery when you wake up and every 2 hours until you get to the Short Stay unit.  If your blood sugar is less than 70 mg/dL, you will need to treat for low blood sugar: Do not take insulin. Treat a low blood sugar (less than 70 mg/dL) with  cup of clear juice (cranberry or apple), 4 glucose tablets, OR glucose gel. Recheck blood sugar in 15 minutes after treatment (to make sure it is greater than 70 mg/dL). If your blood sugar is not greater than 70 mg/dL on recheck, call [phone] for further instructions. Report your blood sugar to the short stay nurse when you get to Short Stay.  If you are admitted to the hospital after surgery: Your blood sugar will be checked by the staff and you will probably be given insulin after surgery (instead of oral  diabetes medicines) to make sure you have good blood sugar levels. The goal for blood sugar control after surgery is 80-180 mg/dL.           Do not wear jewelry. Do not wear lotions, powders, colognes, or deodorant. Men may shave face and neck. Do not bring valuables to the hospital.             [facility] is not responsible for any belongings or valuables.  Do NOT Smoke (Tobacco/Vaping)  24 hours prior to your procedure If you use a CPAP at night, you may bring your  mask for your overnight stay.   Contacts, glasses, dentures or bridgework may not be worn into surgery, please bring cases for these belongings   For patients admitted to the hospital, discharge time will be determined by your treatment team.   Patients discharged the day of surgery will not be allowed to drive home, and someone needs to stay with them for 24 hours.  ONLY 1 SUPPORT PERSON MAY BE PRESENT WHILE YOU ARE IN SURGERY. IF YOU ARE TO BE ADMITTED ONCE YOU ARE IN YOUR ROOM YOU WILL BE ALLOWED TWO (2) VISITORS.  Minor children may have two parents present. Special consideration for safety and communication needs will be reviewed on a case by case basis.  Special instructions:    Oral Hygiene is also important to reduce your risk of infection.  Remember - BRUSH YOUR TEETH THE MORNING OF SURGERY WITH YOUR REGULAR TOOTHPASTE   [place]- Preparing For Surgery  Before surgery, you can play an important role. Because skin is not sterile, your skin needs to be as free of germs as possible. You can reduce the number of germs on your skin by washing with CHG (chlorahexidine gluconate) Soap before surgery.  CHG is an antiseptic cleaner which kills germs and bonds with the skin to continue killing germs even after washing.     Please do not use if you have an allergy to CHG or antibacterial soaps. If your skin becomes reddened/irritated stop using the CHG.  Do not shave (including legs and underarms) for at least 48 hours prior to first CHG shower. It is OK to shave your face.  Please follow these instructions carefully.     Shower the NIGHT BEFORE SURGERY and the MORNING OF SURGERY with CHG Soap.   If you chose to wash your hair, wash your hair first as usual with your normal shampoo. After you shampoo, rinse your hair and body thoroughly to remove the shampoo.  Then [employer] and genitals (private parts) with your normal soap and rinse thoroughly to remove soap.  After that Use CHG Soap  as you would any other liquid soap. You can apply CHG directly to the skin and wash gently with a scrungie or a clean washcloth.   Apply the CHG Soap to your body ONLY FROM THE NECK DOWN.  Do not use on open wounds or open sores. Avoid contact with your eyes, ears, mouth and genitals (private parts). Wash Face and genitals (private parts)  with your normal soap.   Wash thoroughly, paying special attention to the area where your surgery will be performed.  Thoroughly rinse your body with warm water from the neck down.  DO NOT shower/wash with your normal soap after using and rinsing off the CHG Soap.  Pat yourself dry with a CLEAN TOWEL.  Wear CLEAN PAJAMAS to bed the night before surgery  Place CLEAN SHEETS on your bed  the night before your surgery  DO NOT SLEEP WITH PETS.   Day of Surgery:  Take a shower with CHG soap. Wear Clean/Comfortable clothing the morning of surgery Do not apply any deodorants/lotions.   Remember to brush your teeth WITH YOUR REGULAR TOOTHPASTE.   Please read over the following fact sheets that you were given.

## 2021-09-08 ENCOUNTER — Encounter (HOSPITAL_COMMUNITY)
Admission: RE | Admit: 2021-09-08 | Discharge: 2021-09-08 | Disposition: A | Discharge status: Home / Self Care | Payer: Medicare PPO | Source: Ambulatory Visit | Attending: Neurosurgery | Admitting: Neurosurgery

## 2021-09-08 ENCOUNTER — Other Ambulatory Visit: Payer: Self-pay

## 2021-09-08 ENCOUNTER — Encounter (HOSPITAL_COMMUNITY): Payer: Self-pay

## 2021-09-08 ENCOUNTER — Telehealth: Payer: Self-pay

## 2021-09-08 DIAGNOSIS — Z86718 Personal history of other venous thrombosis and embolism: Secondary | ICD-10-CM | POA: Diagnosis not present

## 2021-09-08 DIAGNOSIS — Z7984 Long term (current) use of oral hypoglycemic drugs: Secondary | ICD-10-CM | POA: Diagnosis not present

## 2021-09-08 DIAGNOSIS — D320 Benign neoplasm of cerebral meninges: Secondary | ICD-10-CM | POA: Diagnosis present

## 2021-09-08 DIAGNOSIS — E119 Type 2 diabetes mellitus without complications: Secondary | ICD-10-CM | POA: Diagnosis present

## 2021-09-08 DIAGNOSIS — E785 Hyperlipidemia, unspecified: Secondary | ICD-10-CM | POA: Diagnosis present

## 2021-09-08 DIAGNOSIS — Z794 Long term (current) use of insulin: Secondary | ICD-10-CM | POA: Diagnosis not present

## 2021-09-08 DIAGNOSIS — Z9049 Acquired absence of other specified parts of digestive tract: Secondary | ICD-10-CM | POA: Diagnosis not present

## 2021-09-08 DIAGNOSIS — Z87891 Personal history of nicotine dependence: Secondary | ICD-10-CM | POA: Diagnosis not present

## 2021-09-08 DIAGNOSIS — Z79899 Other long term (current) drug therapy: Secondary | ICD-10-CM | POA: Diagnosis not present

## 2021-09-08 DIAGNOSIS — Z20822 Contact with and (suspected) exposure to covid-19: Secondary | ICD-10-CM | POA: Insufficient documentation

## 2021-09-08 DIAGNOSIS — G9389 Other specified disorders of brain: Secondary | ICD-10-CM | POA: Diagnosis not present

## 2021-09-08 DIAGNOSIS — I6782 Cerebral ischemia: Secondary | ICD-10-CM | POA: Diagnosis not present

## 2021-09-08 DIAGNOSIS — Z01818 Encounter for other preprocedural examination: Secondary | ICD-10-CM | POA: Insufficient documentation

## 2021-09-08 DIAGNOSIS — Z8673 Personal history of transient ischemic attack (TIA), and cerebral infarction without residual deficits: Secondary | ICD-10-CM | POA: Diagnosis not present

## 2021-09-08 DIAGNOSIS — I10 Essential (primary) hypertension: Secondary | ICD-10-CM | POA: Diagnosis present

## 2021-09-08 DIAGNOSIS — M1990 Unspecified osteoarthritis, unspecified site: Secondary | ICD-10-CM | POA: Diagnosis present

## 2021-09-08 DIAGNOSIS — D329 Benign neoplasm of meninges, unspecified: Secondary | ICD-10-CM | POA: Diagnosis not present

## 2021-09-08 DIAGNOSIS — J45909 Unspecified asthma, uncomplicated: Secondary | ICD-10-CM | POA: Diagnosis present

## 2021-09-08 DIAGNOSIS — R471 Dysarthria and anarthria: Secondary | ICD-10-CM | POA: Diagnosis present

## 2021-09-08 DIAGNOSIS — Z7901 Long term (current) use of anticoagulants: Secondary | ICD-10-CM | POA: Diagnosis not present

## 2021-09-08 DIAGNOSIS — H5461 Unqualified visual loss, right eye, normal vision left eye: Secondary | ICD-10-CM | POA: Diagnosis present

## 2021-09-08 DIAGNOSIS — C719 Malignant neoplasm of brain, unspecified: Secondary | ICD-10-CM | POA: Diagnosis not present

## 2021-09-08 HISTORY — DX: Acute embolism and thrombosis of unspecified deep veins of unspecified lower extremity: I82.409

## 2021-09-08 HISTORY — DX: Unspecified osteoarthritis, unspecified site: M19.90

## 2021-09-08 HISTORY — DX: Unspecified asthma, uncomplicated: J45.909

## 2021-09-08 LAB — HEMOGLOBIN A1C
Hgb A1c MFr Bld: 7.8 % — ABNORMAL HIGH (ref 4.8–5.6)
Mean Plasma Glucose: 177.16 mg/dL

## 2021-09-08 LAB — TYPE AND SCREEN
ABO/RH(D): A POS
Antibody Screen: NEGATIVE

## 2021-09-08 LAB — CBC
HCT: 45.9 % (ref 39.0–52.0)
Hemoglobin: 14 g/dL (ref 13.0–17.0)
MCH: 23 pg — ABNORMAL LOW (ref 26.0–34.0)
MCHC: 30.5 g/dL (ref 30.0–36.0)
MCV: 75.4 fL — ABNORMAL LOW (ref 80.0–100.0)
Platelets: 195 10*3/uL (ref 150–400)
RBC: 6.09 MIL/uL — ABNORMAL HIGH (ref 4.22–5.81)
RDW: 19.6 % — ABNORMAL HIGH (ref 11.5–15.5)
WBC: 6.3 10*3/uL (ref 4.0–10.5)
nRBC: 0 % (ref 0.0–0.2)

## 2021-09-08 LAB — GLUCOSE, CAPILLARY: Glucose-Capillary: 184 mg/dL — ABNORMAL HIGH (ref 70–99)

## 2021-09-08 LAB — BASIC METABOLIC PANEL
Anion gap: 7 (ref 5–15)
BUN: 18 mg/dL (ref 8–23)
CO2: 27 mmol/L (ref 22–32)
Calcium: 9.4 mg/dL (ref 8.9–10.3)
Chloride: 107 mmol/L (ref 98–111)
Creatinine, Ser: 0.96 mg/dL (ref 0.61–1.24)
GFR, Estimated: >60 mL/min (ref >60)
Glucose, Bld: 134 mg/dL — ABNORMAL HIGH (ref 70–99)
Potassium: 4 mmol/L (ref 3.5–5.1)
Sodium: 141 mmol/L (ref 135–145)

## 2021-09-08 LAB — SARS CORONAVIRUS 2 (TAT 6-24 HRS): SARS Coronavirus 2: NEGATIVE

## 2021-09-08 NOTE — Telephone Encounter (Signed)
This patient is on the sch for next week, can he be seen?

## 2021-09-08 NOTE — Progress Notes (Signed)
PCP - Monico Blitz, MD w/ [facility]; records requested Cardiologist - Denies  PPM/ICD - Denies  Chest x-ray - N/A EKG - 09/08/21 Stress Test - Denies ECHO - Denies Cardiac Cath - Denies  Sleep Study - Denies   Fasting Blood Sugar - 140s Checks Blood Sugar 2 times a day. CBG at PAT appt was 184. A1C obtained  Blood Thinner Instructions: Per pt, last dose Eliquis was 09/06/21 Aspirin Instructions: N/A  ERAS Protcol - No PRE-SURGERY Ensure or G2- N/A  COVID TEST- 09/18/21   Anesthesia review: Yes, hx DVT; neuro hx  Patient denies shortness of breath, fever, cough and chest pain at PAT appointment   All instructions explained to the patient, with a verbal understanding of the material. Patient agrees to go over the instructions while at home for a better understanding.  The opportunity to ask questions was provided.

## 2021-09-09 NOTE — Telephone Encounter (Signed)
Left a VM

## 2021-09-10 ENCOUNTER — Encounter (HOSPITAL_COMMUNITY): Payer: Self-pay | Admitting: Neurosurgery

## 2021-09-10 ENCOUNTER — Inpatient Hospital Stay (HOSPITAL_COMMUNITY): Payer: Medicare PPO | Admitting: Anesthesiology

## 2021-09-10 ENCOUNTER — Other Ambulatory Visit: Payer: Self-pay

## 2021-09-10 ENCOUNTER — Encounter (HOSPITAL_COMMUNITY)
Admission: RE | Disposition: A | Discharge status: Home / Self Care | Payer: Self-pay | Source: Home / Self Care | Attending: Neurosurgery

## 2021-09-10 ENCOUNTER — Inpatient Hospital Stay (HOSPITAL_COMMUNITY): Payer: Medicare PPO | Admitting: Physician Assistant

## 2021-09-10 ENCOUNTER — Inpatient Hospital Stay (HOSPITAL_COMMUNITY)
Admission: RE | Admit: 2021-09-10 | Discharge: 2021-09-13 | DRG: 027 | Disposition: A | Discharge status: Home / Self Care | Payer: Medicare PPO | Attending: Neurosurgery | Admitting: Neurosurgery

## 2021-09-10 DIAGNOSIS — Z8673 Personal history of transient ischemic attack (TIA), and cerebral infarction without residual deficits: Secondary | ICD-10-CM | POA: Diagnosis not present

## 2021-09-10 DIAGNOSIS — E785 Hyperlipidemia, unspecified: Secondary | ICD-10-CM | POA: Diagnosis present

## 2021-09-10 DIAGNOSIS — R471 Dysarthria and anarthria: Secondary | ICD-10-CM | POA: Diagnosis present

## 2021-09-10 DIAGNOSIS — D320 Benign neoplasm of cerebral meninges: Principal | ICD-10-CM | POA: Diagnosis present

## 2021-09-10 DIAGNOSIS — Z20822 Contact with and (suspected) exposure to covid-19: Secondary | ICD-10-CM | POA: Diagnosis present

## 2021-09-10 DIAGNOSIS — E119 Type 2 diabetes mellitus without complications: Secondary | ICD-10-CM | POA: Diagnosis present

## 2021-09-10 DIAGNOSIS — H5461 Unqualified visual loss, right eye, normal vision left eye: Secondary | ICD-10-CM | POA: Diagnosis present

## 2021-09-10 DIAGNOSIS — Z87891 Personal history of nicotine dependence: Secondary | ICD-10-CM

## 2021-09-10 DIAGNOSIS — J45909 Unspecified asthma, uncomplicated: Secondary | ICD-10-CM | POA: Diagnosis present

## 2021-09-10 DIAGNOSIS — M1990 Unspecified osteoarthritis, unspecified site: Secondary | ICD-10-CM | POA: Diagnosis present

## 2021-09-10 DIAGNOSIS — Z7901 Long term (current) use of anticoagulants: Secondary | ICD-10-CM

## 2021-09-10 DIAGNOSIS — Z9049 Acquired absence of other specified parts of digestive tract: Secondary | ICD-10-CM

## 2021-09-10 DIAGNOSIS — Z7984 Long term (current) use of oral hypoglycemic drugs: Secondary | ICD-10-CM

## 2021-09-10 DIAGNOSIS — I10 Essential (primary) hypertension: Secondary | ICD-10-CM | POA: Diagnosis present

## 2021-09-10 DIAGNOSIS — D329 Benign neoplasm of meninges, unspecified: Secondary | ICD-10-CM | POA: Diagnosis present

## 2021-09-10 DIAGNOSIS — Z86718 Personal history of other venous thrombosis and embolism: Secondary | ICD-10-CM | POA: Diagnosis not present

## 2021-09-10 DIAGNOSIS — Z79899 Other long term (current) drug therapy: Secondary | ICD-10-CM

## 2021-09-10 DIAGNOSIS — Z794 Long term (current) use of insulin: Secondary | ICD-10-CM

## 2021-09-10 HISTORY — PX: CRANIOTOMY: SHX93

## 2021-09-10 HISTORY — PX: APPLICATION OF CRANIAL NAVIGATION: SHX6578

## 2021-09-10 LAB — GLUCOSE, CAPILLARY
Glucose-Capillary: 99 mg/dL (ref 70–99)
Glucose-Capillary: 121 mg/dL — ABNORMAL HIGH (ref 70–99)
Glucose-Capillary: 153 mg/dL — ABNORMAL HIGH (ref 70–99)
Glucose-Capillary: 177 mg/dL — ABNORMAL HIGH (ref 70–99)

## 2021-09-10 LAB — ABO/RH: ABO/RH(D): A POS

## 2021-09-10 SURGERY — CRANIOTOMY TUMOR EXCISION
Anesthesia: General | Laterality: Right

## 2021-09-10 MED ORDER — PROPOFOL 10 MG/ML IV BOLUS
INTRAVENOUS | Status: DC | PRN
  Administered 2021-09-10: 130 mg via INTRAVENOUS
  Administered 2021-09-10: 30 mg via INTRAVENOUS

## 2021-09-10 MED ORDER — LOSARTAN POTASSIUM 50 MG PO TABS
100.0000 mg | ORAL_TABLET | Freq: Every day | ORAL | Status: DC
  Administered 2021-09-10 – 2021-09-13 (×4): 100 mg via ORAL
  Filled 2021-09-10 (×5): qty 2

## 2021-09-10 MED ORDER — PHENYLEPHRINE 40 MCG/ML (10ML) SYRINGE FOR IV PUSH (FOR BLOOD PRESSURE SUPPORT)
PREFILLED_SYRINGE | INTRAVENOUS | Status: DC | PRN
  Administered 2021-09-10: 80 ug via INTRAVENOUS

## 2021-09-10 MED ORDER — MORPHINE SULFATE (PF) 2 MG/ML IV SOLN
1.0000 mg | INTRAVENOUS | Status: DC | PRN
  Administered 2021-09-10: 2 mg via INTRAVENOUS
  Filled 2021-09-10: qty 1

## 2021-09-10 MED ORDER — SODIUM CHLORIDE 0.9 % IV SOLN: INTRAVENOUS | Status: DC

## 2021-09-10 MED ORDER — THROMBIN 5000 UNITS EX SOLR
OROMUCOSAL | Status: DC | PRN
  Administered 2021-09-10: 5 mL via TOPICAL

## 2021-09-10 MED ORDER — LABETALOL HCL 5 MG/ML IV SOLN
10.0000 mg | INTRAVENOUS | Status: DC | PRN
  Administered 2021-09-10: 20 mg via INTRAVENOUS
  Administered 2021-09-10 (×2): 40 mg via INTRAVENOUS
  Administered 2021-09-10: 20 mg via INTRAVENOUS
  Administered 2021-09-12: 40 mg via INTRAVENOUS
  Administered 2021-09-12: 20 mg via INTRAVENOUS
  Administered 2021-09-12 (×2): 40 mg via INTRAVENOUS
  Administered 2021-09-12 – 2021-09-13 (×3): 20 mg via INTRAVENOUS
  Filled 2021-09-10 (×2): qty 4
  Filled 2021-09-10 (×3): qty 8
  Filled 2021-09-10: qty 4
  Filled 2021-09-10 (×2): qty 8
  Filled 2021-09-10: qty 4
  Filled 2021-09-10: qty 8
  Filled 2021-09-10: qty 4

## 2021-09-10 MED ORDER — INSULIN ASPART 100 UNIT/ML IJ SOLN
0.0000 [IU] | Freq: Three times a day (TID) | INTRAMUSCULAR | Status: DC
  Administered 2021-09-11: 7 [IU] via SUBCUTANEOUS
  Administered 2021-09-11: 4 [IU] via SUBCUTANEOUS
  Administered 2021-09-11: 7 [IU] via SUBCUTANEOUS
  Administered 2021-09-12 (×2): 4 [IU] via SUBCUTANEOUS
  Administered 2021-09-12: 3 [IU] via SUBCUTANEOUS
  Administered 2021-09-13: 4 [IU] via SUBCUTANEOUS
  Administered 2021-09-13: 3 [IU] via SUBCUTANEOUS

## 2021-09-10 MED ORDER — ALBUTEROL SULFATE (2.5 MG/3ML) 0.083% IN NEBU: 2.5000 mg | INHALATION_SOLUTION | Freq: Four times a day (QID) | RESPIRATORY_TRACT | Status: DC | PRN

## 2021-09-10 MED ORDER — PROMETHAZINE HCL 25 MG PO TABS: 12.5000 mg | ORAL_TABLET | ORAL | Status: DC | PRN

## 2021-09-10 MED ORDER — LIDOCAINE-EPINEPHRINE 1 %-1:100000 IJ SOLN
INTRAMUSCULAR | Status: AC
  Filled 2021-09-10: qty 1

## 2021-09-10 MED ORDER — DEXAMETHASONE SODIUM PHOSPHATE 4 MG/ML IJ SOLN
4.0000 mg | Freq: Three times a day (TID) | INTRAMUSCULAR | Status: DC
  Administered 2021-09-12 – 2021-09-13 (×3): 4 mg via INTRAVENOUS
  Filled 2021-09-10 (×3): qty 1

## 2021-09-10 MED ORDER — LIDOCAINE-EPINEPHRINE 1 %-1:100000 IJ SOLN
INTRAMUSCULAR | Status: DC | PRN
  Administered 2021-09-10: 5 mL

## 2021-09-10 MED ORDER — ONDANSETRON HCL 4 MG PO TABS: 4.0000 mg | ORAL_TABLET | ORAL | Status: DC | PRN

## 2021-09-10 MED ORDER — CEFAZOLIN SODIUM-DEXTROSE 2-4 GM/100ML-% IV SOLN
2.0000 g | Freq: Three times a day (TID) | INTRAVENOUS | Status: AC
Start: 2021-09-10 — End: 2021-09-11
  Administered 2021-09-10 – 2021-09-11 (×2): 2 g via INTRAVENOUS
  Filled 2021-09-10 (×2): qty 100

## 2021-09-10 MED ORDER — SUGAMMADEX SODIUM 200 MG/2ML IV SOLN
INTRAVENOUS | Status: DC | PRN
  Administered 2021-09-10: 200 mg via INTRAVENOUS

## 2021-09-10 MED ORDER — PHENYLEPHRINE 40 MCG/ML (10ML) SYRINGE FOR IV PUSH (FOR BLOOD PRESSURE SUPPORT)
PREFILLED_SYRINGE | INTRAVENOUS | Status: AC
  Filled 2021-09-10: qty 10

## 2021-09-10 MED ORDER — ONDANSETRON HCL 4 MG/2ML IJ SOLN: 4.0000 mg | INTRAMUSCULAR | Status: DC | PRN

## 2021-09-10 MED ORDER — PANTOPRAZOLE SODIUM 40 MG IV SOLR
40.0000 mg | Freq: Every day | INTRAVENOUS | Status: DC
  Administered 2021-09-10 – 2021-09-12 (×3): 40 mg via INTRAVENOUS
  Filled 2021-09-10 (×3): qty 40

## 2021-09-10 MED ORDER — LEVETIRACETAM IN NACL 1000 MG/100ML IV SOLN
1000.0000 mg | INTRAVENOUS | Status: AC
  Administered 2021-09-10: 1000 mg via INTRAVENOUS
  Filled 2021-09-10: qty 100

## 2021-09-10 MED ORDER — BACITRACIN ZINC 500 UNIT/GM EX OINT
TOPICAL_OINTMENT | CUTANEOUS | Status: AC
  Filled 2021-09-10: qty 28.35

## 2021-09-10 MED ORDER — HEMOSTATIC AGENTS (NO CHARGE) OPTIME
TOPICAL | Status: DC | PRN
  Administered 2021-09-10: 1 via TOPICAL

## 2021-09-10 MED ORDER — OXYCODONE HCL 5 MG PO TABS: 5.0000 mg | ORAL_TABLET | Freq: Once | ORAL | Status: DC | PRN

## 2021-09-10 MED ORDER — DEXAMETHASONE SODIUM PHOSPHATE 4 MG/ML IJ SOLN
4.0000 mg | Freq: Four times a day (QID) | INTRAMUSCULAR | Status: AC
  Administered 2021-09-11 – 2021-09-12 (×4): 4 mg via INTRAVENOUS
  Filled 2021-09-10 (×3): qty 1

## 2021-09-10 MED ORDER — ROCURONIUM BROMIDE 10 MG/ML (PF) SYRINGE
PREFILLED_SYRINGE | INTRAVENOUS | Status: AC
  Filled 2021-09-10: qty 20

## 2021-09-10 MED ORDER — SENNOSIDES-DOCUSATE SODIUM 8.6-50 MG PO TABS: 1.0000 | ORAL_TABLET | Freq: Every evening | ORAL | Status: DC | PRN

## 2021-09-10 MED ORDER — ACETAMINOPHEN 160 MG/5ML PO SOLN: 325.0000 mg | ORAL | Status: DC | PRN

## 2021-09-10 MED ORDER — CHLORHEXIDINE GLUCONATE 0.12 % MT SOLN
15.0000 mL | Freq: Once | OROMUCOSAL | Status: AC
  Administered 2021-09-10: 15 mL via OROMUCOSAL
  Filled 2021-09-10: qty 15

## 2021-09-10 MED ORDER — THROMBIN 20000 UNITS EX SOLR
CUTANEOUS | Status: DC | PRN
  Administered 2021-09-10: 20 mL via TOPICAL

## 2021-09-10 MED ORDER — METOPROLOL SUCCINATE ER 50 MG PO TB24
50.0000 mg | ORAL_TABLET | Freq: Every day | ORAL | Status: DC
  Administered 2021-09-11 – 2021-09-13 (×3): 50 mg via ORAL
  Filled 2021-09-10 (×3): qty 1

## 2021-09-10 MED ORDER — HYDROCODONE-ACETAMINOPHEN 5-325 MG PO TABS
1.0000 | ORAL_TABLET | ORAL | Status: DC | PRN
  Administered 2021-09-11 – 2021-09-12 (×5): 1 via ORAL
  Filled 2021-09-10 (×5): qty 1

## 2021-09-10 MED ORDER — HEPARIN SODIUM (PORCINE) 5000 UNIT/ML IJ SOLN
5000.0000 [IU] | Freq: Three times a day (TID) | INTRAMUSCULAR | Status: DC
  Administered 2021-09-11 – 2021-09-13 (×8): 5000 [IU] via SUBCUTANEOUS
  Filled 2021-09-10 (×7): qty 1

## 2021-09-10 MED ORDER — BACITRACIN ZINC 500 UNIT/GM EX OINT
TOPICAL_OINTMENT | CUTANEOUS | Status: DC | PRN
  Administered 2021-09-10 (×2): 1 via TOPICAL

## 2021-09-10 MED ORDER — LIDOCAINE 2% (20 MG/ML) 5 ML SYRINGE
INTRAMUSCULAR | Status: AC
  Filled 2021-09-10: qty 5

## 2021-09-10 MED ORDER — PROPOFOL 10 MG/ML IV BOLUS
INTRAVENOUS | Status: AC
  Filled 2021-09-10: qty 40

## 2021-09-10 MED ORDER — FENTANYL CITRATE (PF) 250 MCG/5ML IJ SOLN
INTRAMUSCULAR | Status: AC
  Filled 2021-09-10: qty 5

## 2021-09-10 MED ORDER — METFORMIN HCL 500 MG PO TABS
1000.0000 mg | ORAL_TABLET | Freq: Two times a day (BID) | ORAL | Status: DC
  Filled 2021-09-10: qty 2

## 2021-09-10 MED ORDER — LEVETIRACETAM IN NACL 500 MG/100ML IV SOLN
500.0000 mg | Freq: Two times a day (BID) | INTRAVENOUS | Status: DC
  Administered 2021-09-10 – 2021-09-13 (×6): 500 mg via INTRAVENOUS
  Filled 2021-09-10 (×6): qty 100

## 2021-09-10 MED ORDER — CHLORHEXIDINE GLUCONATE CLOTH 2 % EX PADS: 6.0000 | MEDICATED_PAD | Freq: Once | CUTANEOUS | Status: DC

## 2021-09-10 MED ORDER — CEFAZOLIN SODIUM-DEXTROSE 2-4 GM/100ML-% IV SOLN
2.0000 g | INTRAVENOUS | Status: AC
  Administered 2021-09-10: 2 g via INTRAVENOUS
  Filled 2021-09-10: qty 100

## 2021-09-10 MED ORDER — OXYCODONE HCL 5 MG/5ML PO SOLN: 5.0000 mg | Freq: Once | ORAL | Status: DC | PRN

## 2021-09-10 MED ORDER — LABETALOL HCL 5 MG/ML IV SOLN
INTRAVENOUS | Status: AC
  Filled 2021-09-10: qty 4

## 2021-09-10 MED ORDER — GLYCOPYRROLATE PF 0.2 MG/ML IJ SOSY
PREFILLED_SYRINGE | INTRAMUSCULAR | Status: AC
  Filled 2021-09-10: qty 1

## 2021-09-10 MED ORDER — 0.9 % SODIUM CHLORIDE (POUR BTL) OPTIME
TOPICAL | Status: DC | PRN
  Administered 2021-09-10: 3000 mL

## 2021-09-10 MED ORDER — EPHEDRINE 5 MG/ML INJ
INTRAVENOUS | Status: AC
  Filled 2021-09-10: qty 10

## 2021-09-10 MED ORDER — SODIUM CHLORIDE 0.9 % IR SOLN
Status: DC | PRN
  Administered 2021-09-10: 1000 mL

## 2021-09-10 MED ORDER — EMPAGLIFLOZIN 25 MG PO TABS
25.0000 mg | ORAL_TABLET | Freq: Every day | ORAL | Status: DC
  Administered 2021-09-11 – 2021-09-13 (×3): 25 mg via ORAL
  Filled 2021-09-10 (×4): qty 1

## 2021-09-10 MED ORDER — LACTATED RINGERS IV SOLN
INTRAVENOUS | Status: DC
Start: 2021-09-10 — End: 2021-09-10

## 2021-09-10 MED ORDER — BUPIVACAINE HCL (PF) 0.5 % IJ SOLN
INTRAMUSCULAR | Status: DC | PRN
  Administered 2021-09-10: 5 mL

## 2021-09-10 MED ORDER — ACETAMINOPHEN 325 MG PO TABS: 325.0000 mg | ORAL_TABLET | ORAL | Status: DC | PRN

## 2021-09-10 MED ORDER — DEXAMETHASONE SODIUM PHOSPHATE 10 MG/ML IJ SOLN
6.0000 mg | Freq: Four times a day (QID) | INTRAMUSCULAR | Status: AC
  Administered 2021-09-10 – 2021-09-11 (×4): 6 mg via INTRAVENOUS
  Filled 2021-09-10 (×4): qty 1

## 2021-09-10 MED ORDER — ATORVASTATIN CALCIUM 40 MG PO TABS
40.0000 mg | ORAL_TABLET | Freq: Every day | ORAL | Status: DC
  Administered 2021-09-11 – 2021-09-13 (×3): 40 mg via ORAL
  Filled 2021-09-10 (×3): qty 1

## 2021-09-10 MED ORDER — AMLODIPINE BESYLATE 5 MG PO TABS
5.0000 mg | ORAL_TABLET | Freq: Every day | ORAL | Status: DC
  Administered 2021-09-11 – 2021-09-13 (×3): 5 mg via ORAL
  Filled 2021-09-10 (×3): qty 1

## 2021-09-10 MED ORDER — LATANOPROST 0.005 % OP SOLN
1.0000 [drp] | Freq: Every day | OPHTHALMIC | Status: DC
  Administered 2021-09-11 – 2021-09-12 (×2): 1 [drp] via OPHTHALMIC
  Filled 2021-09-10: qty 2.5

## 2021-09-10 MED ORDER — GLYCOPYRROLATE 0.2 MG/ML IJ SOLN
INTRAMUSCULAR | Status: DC | PRN
  Administered 2021-09-10: .1 mg via INTRAVENOUS

## 2021-09-10 MED ORDER — DEXAMETHASONE SODIUM PHOSPHATE 10 MG/ML IJ SOLN
INTRAMUSCULAR | Status: DC | PRN
  Administered 2021-09-10: 10 mg via INTRAVENOUS

## 2021-09-10 MED ORDER — ORAL CARE MOUTH RINSE: 15.0000 mL | Freq: Once | OROMUCOSAL | Status: AC

## 2021-09-10 MED ORDER — BUPIVACAINE HCL (PF) 0.5 % IJ SOLN
INTRAMUSCULAR | Status: AC
  Filled 2021-09-10: qty 30

## 2021-09-10 MED ORDER — THROMBIN 20000 UNITS EX SOLR
CUTANEOUS | Status: AC
  Filled 2021-09-10: qty 20000

## 2021-09-10 MED ORDER — LABETALOL HCL 5 MG/ML IV SOLN
INTRAVENOUS | Status: DC | PRN
  Administered 2021-09-10 (×2): 10 mg via INTRAVENOUS

## 2021-09-10 MED ORDER — ACETAMINOPHEN 325 MG PO TABS
650.0000 mg | ORAL_TABLET | ORAL | Status: DC | PRN
  Administered 2021-09-11 – 2021-09-12 (×2): 650 mg via ORAL
  Filled 2021-09-10 (×3): qty 2

## 2021-09-10 MED ORDER — CLOTRIMAZOLE 1 % EX CREA
1.0000 "application " | TOPICAL_CREAM | Freq: Every day | CUTANEOUS | Status: DC
  Administered 2021-09-11 – 2021-09-13 (×3): 1 via TOPICAL
  Filled 2021-09-10: qty 15

## 2021-09-10 MED ORDER — FENTANYL CITRATE (PF) 100 MCG/2ML IJ SOLN: 25.0000 ug | INTRAMUSCULAR | Status: DC | PRN

## 2021-09-10 MED ORDER — PHENYLEPHRINE HCL-NACL 20-0.9 MG/250ML-% IV SOLN
INTRAVENOUS | Status: DC | PRN
  Administered 2021-09-10: 25 ug/min via INTRAVENOUS

## 2021-09-10 MED ORDER — FENTANYL CITRATE (PF) 250 MCG/5ML IJ SOLN
INTRAMUSCULAR | Status: DC | PRN
  Administered 2021-09-10 (×2): 50 ug via INTRAVENOUS
  Administered 2021-09-10: 100 ug via INTRAVENOUS
  Administered 2021-09-10 (×2): 50 ug via INTRAVENOUS
  Administered 2021-09-10: 100 ug via INTRAVENOUS
  Administered 2021-09-10 (×2): 50 ug via INTRAVENOUS

## 2021-09-10 MED ORDER — CHLORHEXIDINE GLUCONATE CLOTH 2 % EX PADS
6.0000 | MEDICATED_PAD | Freq: Every day | CUTANEOUS | Status: DC
  Administered 2021-09-10 – 2021-09-13 (×4): 6 via TOPICAL

## 2021-09-10 MED ORDER — ONDANSETRON HCL 4 MG/2ML IJ SOLN: 4.0000 mg | Freq: Once | INTRAMUSCULAR | Status: DC | PRN

## 2021-09-10 MED ORDER — ACETAMINOPHEN 650 MG RE SUPP: 650.0000 mg | RECTAL | Status: DC | PRN

## 2021-09-10 MED ORDER — LIDOCAINE 2% (20 MG/ML) 5 ML SYRINGE
INTRAMUSCULAR | Status: DC | PRN
  Administered 2021-09-10: 100 mg via INTRAVENOUS

## 2021-09-10 MED ORDER — THROMBIN 5000 UNITS EX SOLR
CUTANEOUS | Status: AC
  Filled 2021-09-10: qty 5000

## 2021-09-10 MED ORDER — BISACODYL 10 MG RE SUPP: 10.0000 mg | Freq: Every day | RECTAL | Status: DC | PRN

## 2021-09-10 MED ORDER — EPHEDRINE SULFATE-NACL 50-0.9 MG/10ML-% IV SOSY
PREFILLED_SYRINGE | INTRAVENOUS | Status: DC | PRN
  Administered 2021-09-10: 15 mg via INTRAVENOUS
  Administered 2021-09-10: 10 mg via INTRAVENOUS

## 2021-09-10 MED ORDER — MEPERIDINE HCL 25 MG/ML IJ SOLN: 6.2500 mg | INTRAMUSCULAR | Status: DC | PRN

## 2021-09-10 MED ORDER — ROCURONIUM BROMIDE 10 MG/ML (PF) SYRINGE
PREFILLED_SYRINGE | INTRAVENOUS | Status: DC | PRN
  Administered 2021-09-10: 100 mg via INTRAVENOUS
  Administered 2021-09-10: 50 mg via INTRAVENOUS

## 2021-09-10 SURGICAL SUPPLY — 103 items
BAG COUNTER SPONGE SURGICOUNT (BAG) ×4 IMPLANT
BAND RUBBER #18 3X1/16 STRL (MISCELLANEOUS) ×4 IMPLANT
BENZOIN TINCTURE PRP APPL 2/3 (GAUZE/BANDAGES/DRESSINGS) IMPLANT
BLADE CLIPPER SURG (BLADE) ×2 IMPLANT
BLADE SAW GIGLI 16 STRL (MISCELLANEOUS) IMPLANT
BLADE SURG 15 STRL LF DISP TIS (BLADE) IMPLANT
BLADE SURG 15 STRL SS (BLADE)
BLADE ULTRA TIP 2M (BLADE) ×2 IMPLANT
BNDG GAUZE ELAST 4 BULKY (GAUZE/BANDAGES/DRESSINGS) ×4 IMPLANT
BNDG STRETCH 4X75 NS LF (GAUZE/BANDAGES/DRESSINGS) ×2 IMPLANT
BNDG STRETCH 4X75 STRL LF (GAUZE/BANDAGES/DRESSINGS) IMPLANT
BUR ACORN 6.0 PRECISION (BURR) ×2 IMPLANT
BUR ROUND FLUTED 4 SOFT TCH (BURR) IMPLANT
BUR SPIRAL ROUTER 2.3 (BUR) ×2 IMPLANT
CANISTER SUCT 3000ML PPV (MISCELLANEOUS) ×4 IMPLANT
CANISTER SUCTION SONOPET IQ (CANNISTER) ×2 IMPLANT
CARTRIDGE OIL MAESTRO DRILL (MISCELLANEOUS) ×1 IMPLANT
CASSETTE SUCT IRRIG SONOPET IQ (MISCELLANEOUS) ×2 IMPLANT
CATH COUDE FOLEY 2W 5CC 16FR (CATHETERS) ×2 IMPLANT
CATH VENTRIC 35X38 W/TROCAR LG (CATHETERS) IMPLANT
CLIP VESOCCLUDE MED 6/CT (CLIP) IMPLANT
CNTNR URN SCR LID CUP LEK RST (MISCELLANEOUS) ×1 IMPLANT
CONT SPEC 4OZ STRL OR WHT (MISCELLANEOUS) ×1
COVER MAYO STAND STRL (DRAPES) IMPLANT
DECANTER SPIKE VIAL GLASS SM (MISCELLANEOUS) IMPLANT
DIFFUSER DRILL AIR PNEUMATIC (MISCELLANEOUS) ×2 IMPLANT
DRAIN SUBARACHNOID (WOUND CARE) IMPLANT
DRAPE 3/4 80X56 (DRAPES) ×2 IMPLANT
DRAPE HALF SHEET 40X57 (DRAPES) ×2 IMPLANT
DRAPE MICROSCOPE LEICA (MISCELLANEOUS) ×2 IMPLANT
DRAPE NEUROLOGICAL W/INCISE (DRAPES) ×2 IMPLANT
DRAPE STERI IOBAN 125X83 (DRAPES) IMPLANT
DRAPE SURG 17X23 STRL (DRAPES) IMPLANT
DRAPE WARM FLUID 44X44 (DRAPES) ×2 IMPLANT
DRSG ADAPTIC 3X8 NADH LF (GAUZE/BANDAGES/DRESSINGS) ×2 IMPLANT
DRSG TELFA 3X8 NADH (GAUZE/BANDAGES/DRESSINGS) ×2 IMPLANT
DURAPREP 6ML APPLICATOR 50/CS (WOUND CARE) ×2 IMPLANT
ELECT REM PT RETURN 9FT ADLT (ELECTROSURGICAL) ×2
ELECTRODE REM PT RTRN 9FT ADLT (ELECTROSURGICAL) ×1 IMPLANT
EVACUATOR 1/8 PVC DRAIN (DRAIN) IMPLANT
EVACUATOR SILICONE 100CC (DRAIN) IMPLANT
FORCEPS BIPOLAR SPETZLER 8 1.0 (NEUROSURGERY SUPPLIES) ×2 IMPLANT
GAUZE 4X4 16PLY ~~LOC~~+RFID DBL (SPONGE) ×4 IMPLANT
GAUZE SPONGE 4X4 12PLY STRL (GAUZE/BANDAGES/DRESSINGS) ×2 IMPLANT
GLOVE EXAM NITRILE XL STR (GLOVE) IMPLANT
GLOVE SURG ENC MOIS LTX SZ7.5 (GLOVE) IMPLANT
GLOVE SURG LTX SZ7 (GLOVE) ×4 IMPLANT
GLOVE SURG UNDER POLY LF SZ7 (GLOVE) IMPLANT
GLOVE SURG UNDER POLY LF SZ7.5 (GLOVE) ×6 IMPLANT
GOWN STRL REUS W/ TWL LRG LVL3 (GOWN DISPOSABLE) ×2 IMPLANT
GOWN STRL REUS W/ TWL XL LVL3 (GOWN DISPOSABLE) IMPLANT
GOWN STRL REUS W/TWL 2XL LVL3 (GOWN DISPOSABLE) IMPLANT
GOWN STRL REUS W/TWL LRG LVL3 (GOWN DISPOSABLE) ×2
GOWN STRL REUS W/TWL XL LVL3 (GOWN DISPOSABLE)
GRAFT DURAMATRIX ONLAY 3X3X3.5 (Neurosurgery Supplies) ×2 IMPLANT
HEMOSTAT POWDER KIT SURGIFOAM (HEMOSTASIS) ×2 IMPLANT
HEMOSTAT SURGICEL 2X14 (HEMOSTASIS) ×2 IMPLANT
HOOK DURA 1/2IN (MISCELLANEOUS) ×2 IMPLANT
IV NS 1000ML (IV SOLUTION) ×1
IV NS 1000ML BAXH (IV SOLUTION) ×1 IMPLANT
KIT BASIN OR (CUSTOM PROCEDURE TRAY) ×2 IMPLANT
KIT DRAIN CSF ACCUDRAIN (MISCELLANEOUS) IMPLANT
KIT TURNOVER KIT B (KITS) ×2 IMPLANT
KNIFE ARACHNOID DISP AM-24-S (MISCELLANEOUS) ×2 IMPLANT
MARKER SPHERE PSV REFLC 13MM (MARKER) ×4 IMPLANT
NEEDLE HYPO 22GX1.5 SAFETY (NEEDLE) ×2 IMPLANT
NEEDLE SPNL 18GX3.5 QUINCKE PK (NEEDLE) IMPLANT
NS IRRIG 1000ML POUR BTL (IV SOLUTION) ×6 IMPLANT
OIL CARTRIDGE MAESTRO DRILL (MISCELLANEOUS) ×2
PACK BATTERY CMF DISP FOR DVR (ORTHOPEDIC DISPOSABLE SUPPLIES) ×2 IMPLANT
PACK CRANIOTOMY CUSTOM (CUSTOM PROCEDURE TRAY) ×2 IMPLANT
PATTIES SURGICAL .25X.25 (GAUZE/BANDAGES/DRESSINGS) IMPLANT
PATTIES SURGICAL .5 X.5 (GAUZE/BANDAGES/DRESSINGS) ×2 IMPLANT
PATTIES SURGICAL .5 X3 (DISPOSABLE) ×2 IMPLANT
PATTIES SURGICAL 1/4 X 3 (GAUZE/BANDAGES/DRESSINGS) IMPLANT
PATTIES SURGICAL 1X1 (DISPOSABLE) IMPLANT
PIN MAYFIELD SKULL DISP (PIN) ×2 IMPLANT
PLATE BONE 12 2H TARGET XL (Plate) ×6 IMPLANT
PLATE CRANIAL 4H UNI NEURO III (Plate) ×2 IMPLANT
SCREW UNIII AXS SD 1.5X4 (Screw) ×16 IMPLANT
SPECIMEN JAR SMALL (MISCELLANEOUS) IMPLANT
SPONGE NEURO XRAY DETECT 1X3 (DISPOSABLE) IMPLANT
SPONGE SURGIFOAM ABS GEL 100 (HEMOSTASIS) ×2 IMPLANT
SPONGE T-LAP 4X18 ~~LOC~~+RFID (SPONGE) ×2 IMPLANT
STAPLER VISISTAT 35W (STAPLE) ×4 IMPLANT
STOCKINETTE 6  STRL (DRAPES)
STOCKINETTE 6 STRL (DRAPES) IMPLANT
SUT ETHILON 3 0 FSL (SUTURE) IMPLANT
SUT ETHILON 3 0 PS 1 (SUTURE) IMPLANT
SUT NURALON 4 0 TR CR/8 (SUTURE) ×4 IMPLANT
SUT SILK 0 TIES 10X30 (SUTURE) IMPLANT
SUT VIC AB 0 CT1 18XCR BRD8 (SUTURE) ×2 IMPLANT
SUT VIC AB 0 CT1 8-18 (SUTURE) ×2
SUT VIC AB 3-0 SH 8-18 (SUTURE) ×6 IMPLANT
TAPE CLOTH 1X10 TAN NS (GAUZE/BANDAGES/DRESSINGS) ×2 IMPLANT
TIP SONOPET IQ 12 MICRO CLAW (TIP) ×2 IMPLANT
TIP TISSUE SONOPET IQ STD 12 (TIP) ×2 IMPLANT
TOWEL GREEN STERILE (TOWEL DISPOSABLE) ×2 IMPLANT
TOWEL GREEN STERILE FF (TOWEL DISPOSABLE) ×2 IMPLANT
TRAY FOLEY MTR SLVR 16FR STAT (SET/KITS/TRAYS/PACK) ×2 IMPLANT
TUBE CONNECTING 12X1/4 (SUCTIONS) ×2 IMPLANT
UNDERPAD 30X36 HEAVY ABSORB (UNDERPADS AND DIAPERS) ×2 IMPLANT
WATER STERILE IRR 1000ML POUR (IV SOLUTION) ×2 IMPLANT

## 2021-09-10 NOTE — H&P (Signed)
Chief Complaint  Visual Loss  History of Present Illness  Blake Lawson is a 86 y.o. male presenting to the outpatient neurosurgery clinic with visual loss in the right eye.  His work-up included MRI scan demonstrating a right planum meningioma with enhancement of the optic nerve sheath on the right side.  Tumor extended back towards the cisternal segment of the optic nerve and optic chiasm.  In an effort to preserve contralateral vision and provide the best chance for improvement on the right side, surgical resection was recommended.  The patient's case was discussed at the multidisciplinary neuro-oncology conference.  Past Medical History   Past Medical History:  Diagnosis Date   Arthritis    Asthma    Diabetes mellitus, type II ([place])    DVT (deep venous thrombosis) (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 Authorizing Provider  albuterol (VENTOLIN HFA) 108 (90 Base) MCG/ACT inhaler Inhale 2 puffs into the lungs every 6 (six) hours as needed for wheezing or shortness of breath. 10/11/20  Yes [provider]  amLODipine (NORVASC) 5 MG tablet Take 5 mg by mouth daily.   Yes [provider]  apixaban (ELIQUIS) 5 MG TABS tablet Take 5 mg by mouth 2 (two) times daily.   Yes [provider]  atorvastatin (LIPITOR) 40 MG tablet Take 40 mg by mouth daily.   Yes [provider]  clotrimazole (LOTRIMIN) 1 % cream Apply 1  application topically daily. 05/19/21  Yes [provider]  empagliflozin (JARDIANCE) 25 MG TABS tablet Take 25 mg by mouth daily.   Yes [provider]  insulin NPH-regular Human (HUMULIN 70/30) (70-30) 100 UNIT/ML injection Inject 25-35 Units into the skin 2 (two) times daily with a meal. Per sliding Scale   Yes [provider]  latanoprost (XALATAN) 0.005 % ophthalmic solution Place 1 drop into both eyes at bedtime. 06/29/21  Yes [provider]  losartan (COZAAR) 100 MG tablet Take 100 mg by mouth daily.   Yes [provider]  metFORMIN (GLUCOPHAGE) 500 MG tablet Take 1,000 mg by mouth 2 (two) times daily with a meal.   Yes [provider]  metoprolol succinate (TOPROL-XL) 100 MG 24 hr tablet Take 50 mg by mouth daily. Take with or immediately following a meal.   Yes [provider]    Allergies  No Known Allergies  Review of Systems  ROS  Neurologic Exam  Awake, alert, oriented Memory and concentration grossly intact Speech fluent, appropriate CN grossly intact x only light perception OD Motor exam: Upper Extremities Deltoid Bicep Tricep Grip  Right 5/5 5/5 5/5 5/5  Left 5/5 5/5 5/5 5/5   Lower Extremities IP Quad PF DF EHL  Right 5/5 5/5 5/5 5/5 5/5  Left 5/5 5/5 5/5 5/5 5/5   Sensation grossly intact  to LT  Imaging  MRI of the brain was again reviewed and demonstrates homogeneously enhancing lesion in the posterior aspect of the planum extending into the sella.  There is also significant enhancement of the optic nerve sheath on the right side extending into the orbit.  Unclear if there is actual tumor within the optic canal.  Impression  - 86 y.o. male with right planum meningioma and near complete right visual loss.  Plan  -We will plan on proceeding with right craniotomy for resection of tumor  I have reviewed the details of the surgery as well as the expected postoperative course with the patient at length  in the outpatient clinic.  We have discussed the associated risks, benefits, and alternatives to surgery.  All his questions were answered.  He provided informed consent to proceed.   Consuella Lose, MD [facility]

## 2021-09-10 NOTE — Anesthesia Procedure Notes (Signed)
Arterial Line Insertion Start/End9/16/2022 9:05 AM, 09/10/2021 9:21 AM Performed by: Josephine Igo, CRNA, CRNA  Preanesthetic checklist: patient identified, IV checked, risks and benefits discussed, surgical consent and monitors and equipment checked Lidocaine 1% used for infiltration Left, radial was placed Catheter size: 20 G Hand hygiene performed  and maximum sterile barriers used   Attempts: 1 Procedure performed without using ultrasound guided technique. Following insertion, dressing applied and Biopatch. Post procedure assessment: normal  Patient tolerated the procedure well with no immediate complications.

## 2021-09-10 NOTE — Anesthesia Procedure Notes (Signed)
Procedure Name: Intubation Date/Time: 09/10/2021 10:15 AM Performed by: Lance Coon, CRNA Pre-anesthesia Checklist: Patient identified, Emergency Drugs available, Suction available, Patient being monitored and Timeout performed Patient Re-evaluated:Patient Re-evaluated prior to induction Oxygen Delivery Method: Circle system utilized Preoxygenation: Pre-oxygenation with 100% oxygen Induction Type: IV induction Ventilation: Mask ventilation without difficulty Laryngoscope Size: Miller and 3 Grade View: Grade I Tube type: Oral Tube size: 7.5 mm Number of attempts: 1 Airway Equipment and Method: Stylet Placement Confirmation: ETT inserted through vocal cords under direct vision, positive ETCO2 and breath sounds checked- equal and bilateral Secured at: 22 cm Tube secured with: Tape Dental Injury: Teeth and Oropharynx as per pre-operative assessment

## 2021-09-10 NOTE — Anesthesia Preprocedure Evaluation (Addendum)
Anesthesia Evaluation  Patient identified by MRN, date of birth, ID band Patient awake    Reviewed: Allergy & Precautions, H&P , NPO status , Patient's Chart, lab work & pertinent test results, reviewed documented beta blocker date and time   Airway Mallampati: II  TM Distance: >3 FB Neck ROM: full    Dental no notable dental hx. (+) Teeth Intact, Dental Advisory Given   Pulmonary asthma , former smoker,    Pulmonary exam normal breath sounds clear to auscultation       Cardiovascular Exercise Tolerance: Good hypertension, Pt. on medications and Pt. on home beta blockers + DVT  Normal cardiovascular exam Rhythm:regular Rate:Normal     Neuro/Psych CVA, Residual Symptoms negative psych ROS   GI/Hepatic negative GI ROS, Neg liver ROS,   Endo/Other  diabetes, Type 2  Renal/GU negative Renal ROS  negative genitourinary   Musculoskeletal  (+) Arthritis , Osteoarthritis,    Abdominal   Peds  Hematology negative hematology ROS (+)   Anesthesia Other Findings   Reproductive/Obstetrics negative OB ROS                            Anesthesia Physical Anesthesia Plan  ASA: 3  Anesthesia Plan: General   Post-op Pain Management:    Induction: Intravenous  PONV Risk Score and Plan: 2 and Ondansetron, Treatment may vary due to age or medical condition and Dexamethasone  Airway Management Planned: Oral ETT  Additional Equipment: Arterial line  Intra-op Plan:   Post-operative Plan: Extubation in OR  Informed Consent: I have reviewed the patients History and Physical, chart, labs and discussed the procedure including the risks, benefits and alternatives for the proposed anesthesia with the patient or authorized representative who has indicated his/her understanding and acceptance.     Dental Advisory Given  Plan Discussed with: CRNA and Anesthesiologist  Anesthesia Plan Comments: (  )         Anesthesia Quick Evaluation

## 2021-09-10 NOTE — Op Note (Signed)
NEUROSURGERY OPERATIVE NOTE   PREOP DIAGNOSIS:  Anterior skull base meningioma   POSTOP DIAGNOSIS: Same  PROCEDURE: Stereotactic right pterional craniotomy for resection of tumor Use of intraoperative microscope for microdissection  SURGEON: Dr. Consuella Lose, MD  ASSISTANT: None  ANESTHESIA: General Endotracheal  EBL: 400cc  SPECIMENS: Right frontal tumor for permanent pathology  DRAINS: None  COMPLICATIONS: None immediate  CONDITION: Hemodynamically stable to PACU  HISTORY: Blake Lawson is a 86 y.o. male initially presented to the outpatient neurosurgery clinic after referral for visual loss.  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  Patient's case was discussed at the multidisciplinary neuro-oncology conference where opinion was to proceed with surgical resection in order to provide chance for improved vision in the right and lower risk of worsening vision on the contralateral side.  The risks, benefits, and alternatives to surgery were all reviewed in detail with the patient.  After all his questions were answered informed consent was obtained and witnessed.  PROCEDURE IN DETAIL: The patient was brought to the operating room. After induction of general anesthesia, the patient was positioned on the operative table in the [place] head holder in the supine position.  Preoperative stereotactic MRI scan was then coregistered with surface markers until a satisfactory accuracy was achieved.  A standard curvilinear frontotemporal skin incision was then marked out.  All pressure points were meticulously padded. Skin incision was then marked out and prepped and draped in the usual sterile fashion.  After timeout was conducted, skin incision was infiltrated with local anesthetic with epinephrine.  Incision was then made sharply and carried down through the  galea.  Raney clips were applied.  Bovie was used to incise the temporalis muscle and fascia.  A single piece myocutaneous flap was then elevated and reflected anteriorly.  Bur holes were then created on the pterion, superior temporal line, and just above the root of the zygoma.  These were connected with the craniotome and a single piece frontotemporal flap was elevated.  Dura was noted to be significantly adherent to the undersurface of the bone flap.  High-speed drill was then used to drill down the lesser wing of the sphenoid in order to provide unobstructed view to the optical carotid cistern.  Hemostasis was then secured on the epidural plane with a combination of bipolar electrocautery and morselized Gelfoam with thrombin.  At this point the microscope was draped sterilely and the remainder of the tumor resection was performed under the microscope using microdissection technique.  Initially, a subfrontal approach was employed to proceed down to the level of the optic canal.  Initially I was unable to identify the optic nerve as there was a significant amount of meningioma extending around the nerve and the roof of the optic canal.  I dissected somewhat laterally however it became clear that in order to elevate the frontal lobe I would have to dissect the sylvian fissure.  Attention was therefore turned a little bit more laterally and the proximal sylvian fissure was dissected to identify the internal carotid artery in its supraclinoid segment to the ICA bifurcation.  This then allowed gentle elevation of the frontal lobe and dissection of the tumor away from the frontal lobe.  I was then able to identify the optic nerve on the right side as well as the optic chiasm.  Further dissection towards the contralateral side allowed identification of the contralateral  optic nerve.  At this point, bipolar electrocautery was used to coagulate the base of the tumor along the planum.  Pieces of tumor were then removed  and sent for permanent pathology.  Once this was done, I was then able to dissect the tumor completely away from the optic chiasm and it was noted to be extending to the medial margin of the contralateral left optic nerve.  These portions of tumor were removed.  Attention was then turned to portion of the tumor on the medial and undersurface of the right optic nerve.  Initially, the medial border of the right optic nerve was identified and the arachnoid plane at this level was incised.  Using a combination of blunt dissectors, tumor was removed from the undersurface of the optic nerve.  I then coagulated the dura overlying the optic canal.  The ultrasonic bone cutting tool was then used to unroofed the optic canal until the dura surrounding the optic nerve was identified.  The dura was then incised in order to fully release the optic nerve and it was resected.  I then used a combination of blunt dissectors to remove the remainder of tumor from the undersurface of the optic nerve into the proximal optic canal.  At this point having decompressed the optic nerve, I elected to proceed with closure.  Based on the preoperative MRI scan, there was certainly tumor remaining likely within the sella as well as the right cavernous sinus.  The wound was irrigated with normal saline irrigation.  Hemostasis was easily achieved with morselized Gelfoam with thrombin.  The dura was then reapproximated and covered with a collagen onlay graft.  Bone flap was then replaced and plated with standard titanium plates and screws.  Wound was again irrigated.  Temporalis muscle and fascia were closed with interrupted 0 Vicryl stitches.  The galea was reapproximated with interrupted 0 and 3-0 Vicryl stitches and the skin was closed with staples.  Patient was then removed from the [place] head holder and sterile dressing with head wrap was applied.  At the end of the case all sponge, needle, instrument, and cottonoid counts were correct.   Patient was then extubated and taken to the postanesthesia care unit in stable hemodynamic condition.   Consuella Lose, MD [facility]

## 2021-09-10 NOTE — Transfer of Care (Signed)
Immediate Anesthesia Transfer of Care Note  Patient: Blake Lawson  Procedure(s) Performed: 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 (Right) APPLICATION OF CRANIAL NAVIGATION (Right)  Patient Location: PACU  Anesthesia Type:General  Level of Consciousness: drowsy and patient cooperative  Airway & Oxygen Therapy: Patient Spontanous Breathing  Post-op Assessment: Report given to RN and Post -op Vital signs reviewed and stable  Post vital signs: Reviewed and stable  Last Vitals:  Vitals Value Taken Time  BP 129/63 09/10/21 1422  Temp    Pulse    Resp 17 09/10/21 1425  SpO2    Vitals shown include unvalidated device data.  Last Pain:  Vitals:   09/10/21 0851  TempSrc:   PainSc: 0-No pain         Complications: No notable events documented.

## 2021-09-11 ENCOUNTER — Inpatient Hospital Stay (HOSPITAL_COMMUNITY): Payer: Medicare PPO

## 2021-09-11 LAB — GLUCOSE, CAPILLARY
Glucose-Capillary: 203 mg/dL — ABNORMAL HIGH (ref 70–99)
Glucose-Capillary: 233 mg/dL — ABNORMAL HIGH (ref 70–99)
Glucose-Capillary: 184 mg/dL — ABNORMAL HIGH (ref 70–99)
Glucose-Capillary: 152 mg/dL — ABNORMAL HIGH (ref 70–99)

## 2021-09-11 IMAGING — MR MR HEAD WO/W CM
14 of 20 series · 33 of 48 positions shown · IV contrast (gadavist)
Comparison: [DATE] and [DATE]

CLINICAL DATA: Brain/CNS neoplasm, assess treatment response

EXAM:
MRI HEAD WITHOUT AND WITH CONTRAST
TECHNIQUE: Multiplanar, multiecho pulse sequences of the brain and surrounding
structures were obtained without and with intravenous contrast.
CONTRAST:  8mL GADAVIST GADOBUTROL 1 MMOL/ML IV SOLN

[Series 5: DWI · axial · 3.0mm · 0.88mm/px · z∈[-12,+125]mm · 4 of 104 slices shown (1 of 4)]
[im 1/104]
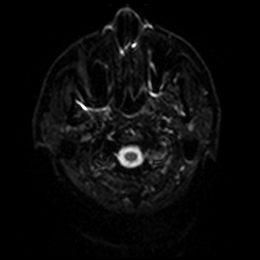
[im 35/104]
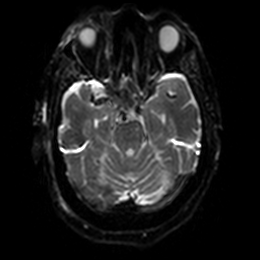
[im 69/104]
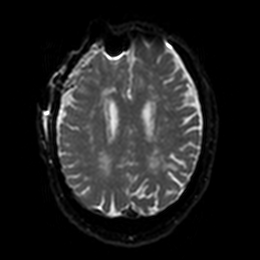
[im 104/104]
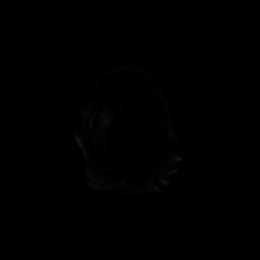

[Series 6: DWI · axial · 3.0mm · 0.88mm/px · z∈[-12,+125]mm · 3 of 52 slices shown (2 of 4)]
[im 1/52]
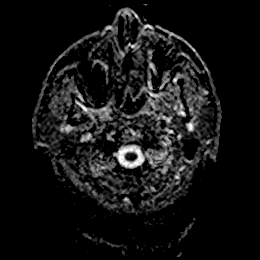
[im 26/52]
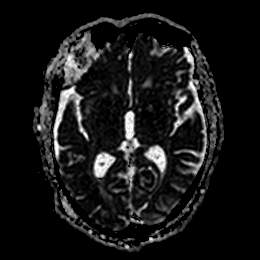
[im 52/52]
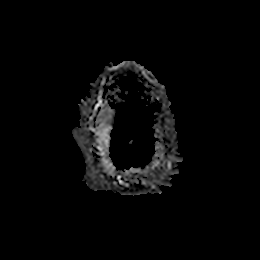

[Series 7: DWI · coronal · 4.0mm · 0.88mm/px · 4 of 76 slices shown (3 of 4)]
[im 1/76]
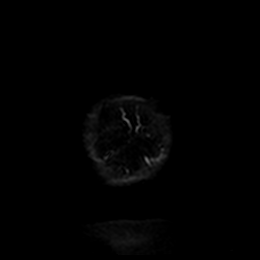
[im 26/76]
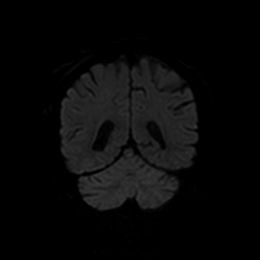
[im 51/76]
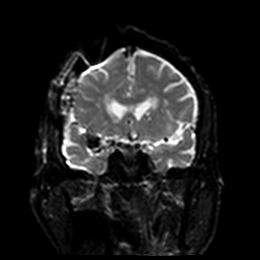
[im 76/76]
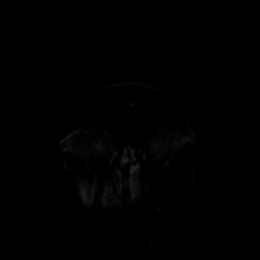

[Series 8: DWI · coronal · 4.0mm · 0.88mm/px · 2 of 37 slices shown (4 of 4)]
[im 1/37]
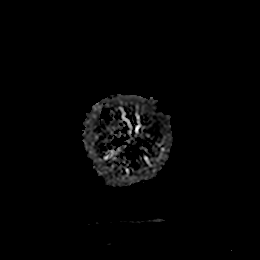
[im 37/37]
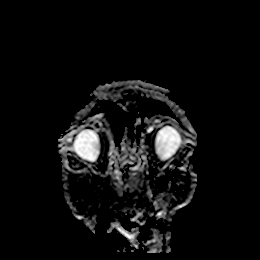

[Series 9: T1 · sagittal · 5.0mm · 0.75mm/px · 1 of 25 slices shown]
[im 1/25]
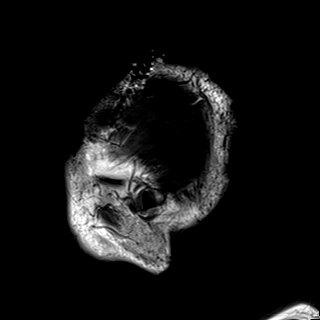

[Series 10: T2 · axial · 5.0mm · 0.72mm/px · 1 of 27 slices shown]
[im 1/27]
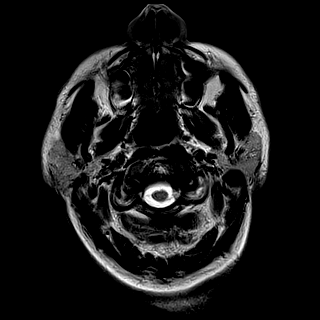

[Series 11: FLAIR · axial · 5.0mm · 0.45mm/px · 1 of 27 slices shown]
[im 1/27]
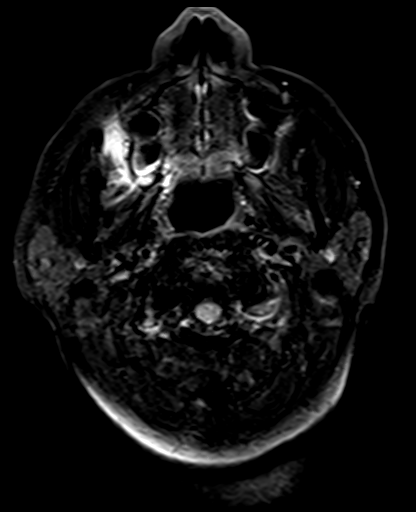

[Series 12: mag_images · axial · 3.0mm · 0.90mm/px · z∈[-21,+138]mm · 3 of 60 slices shown]
[im 1/60]
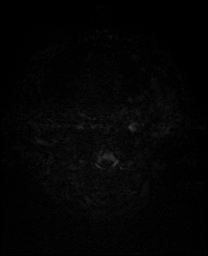
[im 30/60]
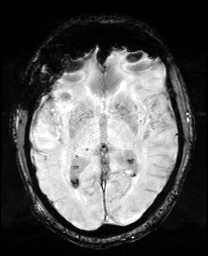
[im 60/60]
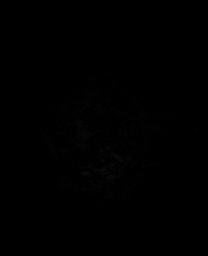

[Series 13: pha_images · axial · 3.0mm · 0.90mm/px · z∈[-18,+138]mm · 3 of 59 slices shown]
[im 1/59]
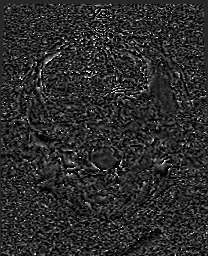
[im 30/59]
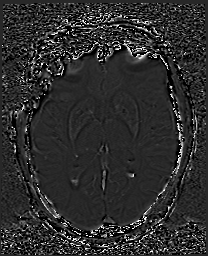
[im 59/59]
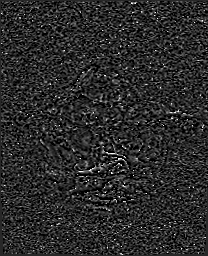

[Series 14: swi_images · axial · 3.0mm · 0.90mm/px · z∈[-21,+138]mm · 3 of 60 slices shown]
[im 1/60]
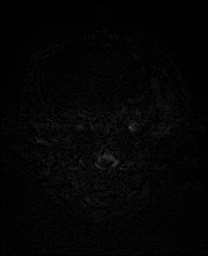
[im 30/60]
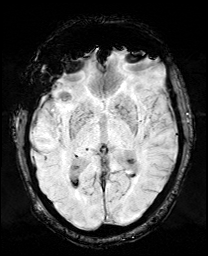
[im 60/60]
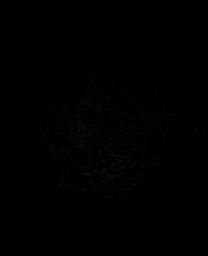

[Series 15: mip_images(sw) · axial · 24.0mm · 0.90mm/px · z∈[-12,+129]mm · 3 of 53 slices shown]
[im 1/53]
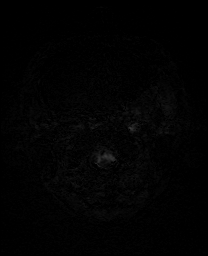
[im 27/53]
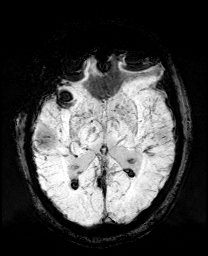
[im 53/53]
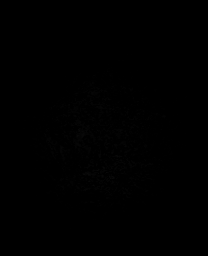

[Series 17: T2 post-contrast · coronal · 5.0mm · 0.72mm/px · 2 of 32 slices shown]
[im 1/32]
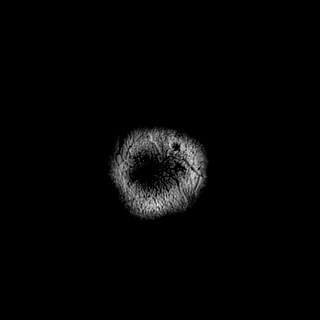
[im 32/32]
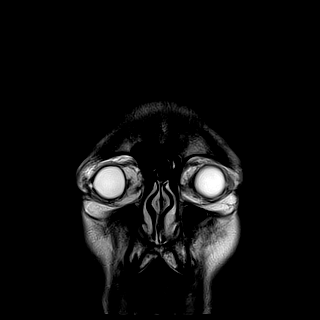

[Series 19: T1 post-contrast · coronal · 5.0mm · 0.34mm/px · 2 of 32 slices shown (1 of 2)]
[im 1/32]
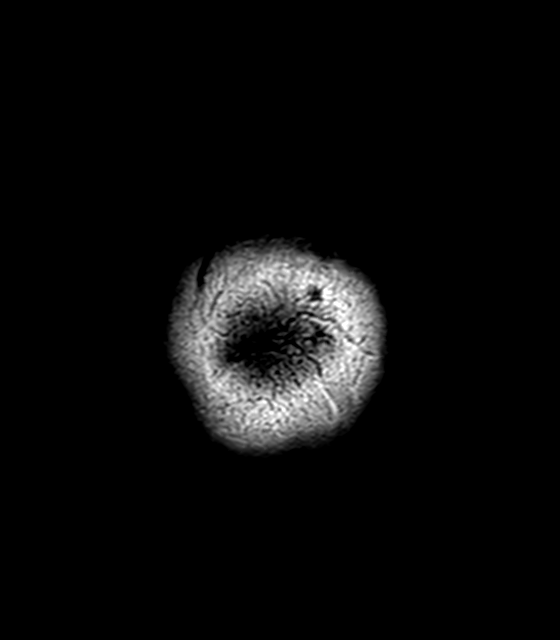
[im 32/32]
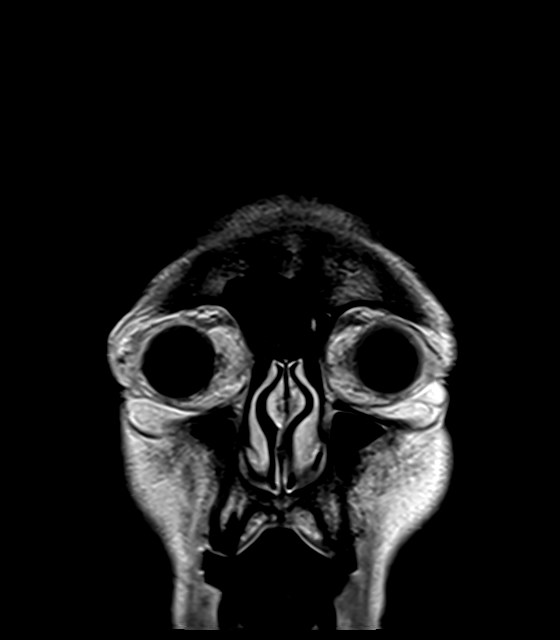

[Series 20: T1 post-contrast · sagittal · 5.0mm · 0.72mm/px · 1 of 25 slices shown (2 of 2)]
[im 1/25]
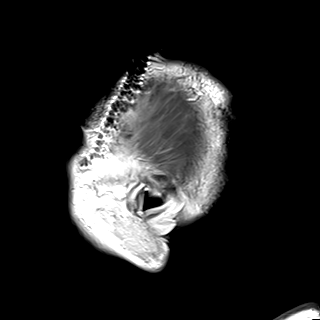

[33 of 48 positions shown; findings below may reference images not displayed]

FINDINGS: Brain: Status post resection of planum sphenoidale meningioma.
Residual contrast enhancement inferior to the distal cavernous
segment of the right internal carotid artery may be a small amount
of residual mass. A left sphenoid wing meningioma is unchanged.
There is a heterogeneous collection at the right anterior
extra-axial space, underlying the craniotomy site. Mild mass effect
on the right frontal lobe. There is bifrontal pneumocephalus.

There is a punctate focus of acute ischemia within the left
midbrain. There is multifocal periventricular white matter
hyperintensity, most often a result of chronic microvascular
ischemia. Mild generalized volume loss. Focal area of signal loss
adjacent to the right MCA bifurcation may be a surgical clip, but is
indeterminate on this study. There is mild diffuse dural enhancement
which is compatible with recent surgery.

Vascular: Major flow voids are preserved.

Skull and upper cervical spine: Normal calvarium and skull base.
Visualized upper cervical spine and soft tissues are normal.

Sinuses/Orbits:No paranasal sinus fluid levels or advanced mucosal
thickening. No mastoid or middle ear effusion. Normal orbits.
IMPRESSION: 1. Status post resection of planum sphenoidale meningioma with
possible small residual mass inferior to the right internal carotid
artery distal cavernous segment. This is a baseline for future
studies.
2. Unchanged left sphenoid wing meningioma.
3. Postoperative collection overlying the right frontal lobe and
causing mild mass effect.
4. Punctate focus of acute ischemia in the left midbrain.

## 2021-09-11 MED ORDER — METFORMIN HCL 500 MG PO TABS
1000.0000 mg | ORAL_TABLET | Freq: Two times a day (BID) | ORAL | Status: DC
  Administered 2021-09-13: 1000 mg via ORAL
  Filled 2021-09-11: qty 2

## 2021-09-11 MED ORDER — GADOBUTROL 1 MMOL/ML IV SOLN
8.0000 mL | Freq: Once | INTRAVENOUS | Status: AC | PRN
  Administered 2021-09-11: 8 mL via INTRAVENOUS

## 2021-09-11 NOTE — Progress Notes (Signed)
  NEUROSURGERY PROGRESS NOTE   Pt seen and examined. No issues overnight. Pt reports minimal HA. Left eye vision good, no reported change in right eye  EXAM: Temp:  [97.3 F (36.3 C)-99.9 F (37.7 C)] 99.9 F (37.7 C) (09/17 0800) Pulse Rate:  [45-100] 96 (09/17 0800) Resp:  [15-27] 24 (09/17 0800) BP: (129-173)/(62-90) 142/68 (09/17 0800) SpO2:  [94 %-100 %] 94 % (09/17 0800) Arterial Line BP: (177-244)/(55-84) 244/81 (09/16 2000) Intake/Output      09/16 0701 09/17 0700 09/17 0701 09/18 0700   I.V. (mL/kg) 3365.5 (41) 131.2 (1.6)   IV Piggyback 300 100   Total Intake(mL/kg) 3665.5 (44.6) 231.2 (2.8)   Urine (mL/kg/hr) 2950    Blood 400    Total Output 3350    Net +315.5 +231.2         Awake, alert, oriented Speech somewhat dysarthric but fluent, appropriate. Naming/repetition intact Face symmetric, Tongue midline, uvula midline MAE good strength Wound c/d/i  LABS: Lab Results  Component Value Date   CREATININE 0.96 09/08/2021   BUN 18 09/08/2021   NA 141 09/08/2021   K 4.0 09/08/2021   CL 107 09/08/2021   CO2 27 09/08/2021   Lab Results  Component Value Date   WBC 6.3 09/08/2021   HGB 14.0 09/08/2021   HCT 45.9 09/08/2021   MCV 75.4 (L) 09/08/2021   PLT 195 09/08/2021    IMAGING: MRI reviewed with near total resection. Residual tumor likely within right cavernous sinus, possibly within sella. Small punctate ischemic region within midbrain  IMPRESSION: - 86 y.o. male POD#1 s/p resection of planum meningioma, doing well. Mild dysarthria but with reassuring MRI  PLAN: - Cont to monitor in ICU - can d/c A-line, Foley - Mobilize as tolerated    Blake Lose, MD [facility]

## 2021-09-12 LAB — GLUCOSE, CAPILLARY
Glucose-Capillary: 130 mg/dL — ABNORMAL HIGH (ref 70–99)
Glucose-Capillary: 161 mg/dL — ABNORMAL HIGH (ref 70–99)
Glucose-Capillary: 173 mg/dL — ABNORMAL HIGH (ref 70–99)
Glucose-Capillary: 176 mg/dL — ABNORMAL HIGH (ref 70–99)

## 2021-09-12 NOTE — Progress Notes (Signed)
  NEUROSURGERY PROGRESS NOTE   Pt seen and examined. No issues overnight. Pt reports minimal HA. Vision remains stable from preop.  EXAM: Temp:  [97.8 F (36.6 C)-101.8 F (38.8 C)] 98.8 F (37.1 C) (09/18 0346) Pulse Rate:  [73-97] 90 (09/18 0907) Resp:  [10-31] 29 (09/18 0600) BP: (127-165)/(21-87) 163/79 (09/18 0600) SpO2:  [89 %-95 %] 91 % (09/18 0600) Intake/Output      09/17 0701 09/18 0700 09/18 0701 09/19 0700   I.V. (mL/kg) 1549.8 (18.9)    IV Piggyback 200    Total Intake(mL/kg) 1749.8 (21.3)    Urine (mL/kg/hr) 1850 (0.9)    Stool 0    Blood     Total Output 1850    Net -100.2         Urine Occurrence 1 x    Stool Occurrence 1 x     Awake, alert, oriented Speech fluent, no dysarthria today Face symmetric, Tongue midline, uvula midline MAE good strength Wound c/d/i  LABS: Lab Results  Component Value Date   CREATININE 0.96 09/08/2021   BUN 18 09/08/2021   NA 141 09/08/2021   K 4.0 09/08/2021   CL 107 09/08/2021   CO2 27 09/08/2021   Lab Results  Component Value Date   WBC 6.3 09/08/2021   HGB 14.0 09/08/2021   HCT 45.9 09/08/2021   MCV 75.4 (L) 09/08/2021   PLT 195 09/08/2021    IMPRESSION: - 85 y.o. male POD#2 s/p resection of planum meningioma, doing well. Fever yesterday  PLAN: - Can transfer to PCU - Incentive spirometer - d/c condom cath - OOB - Hopefully d/c home tomorrow    Consuella Lose, MD [facility]

## 2021-09-12 NOTE — Anesthesia Postprocedure Evaluation (Signed)
Anesthesia Post Note  Patient: Blake Lawson  Procedure(s) Performed: 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 (Right) APPLICATION OF CRANIAL NAVIGATION (Right)     Anesthesia Post Evaluation No notable events documented.  Last Vitals:  Vitals:   09/12/21 1900 09/12/21 2000  BP: 140/68 (!) 158/74  Pulse: 77 93  Resp: 20 (!) 29  Temp:  36.9 C  SpO2: 92% 96%    Last Pain:  Vitals:   09/12/21 2000  TempSrc: Oral  PainSc: 0-No pain                 Tramon Crescenzo

## 2021-09-13 LAB — GLUCOSE, CAPILLARY
Glucose-Capillary: 122 mg/dL — ABNORMAL HIGH (ref 70–99)
Glucose-Capillary: 172 mg/dL — ABNORMAL HIGH (ref 70–99)

## 2021-09-13 MED ORDER — APIXABAN 5 MG PO TABS: 5.0000 mg | ORAL_TABLET | Freq: Two times a day (BID) | ORAL | Status: AC

## 2021-09-13 MED ORDER — HYDROCODONE-ACETAMINOPHEN 5-325 MG PO TABS: 1.0000 | ORAL_TABLET | ORAL | 0 refills | Status: DC | PRN

## 2021-09-13 MED ORDER — LEVETIRACETAM 500 MG PO TABS: 500.0000 mg | ORAL_TABLET | Freq: Two times a day (BID) | ORAL | 0 refills | Status: DC

## 2021-09-13 MED ORDER — METHYLPREDNISOLONE 4 MG PO TBPK: ORAL_TABLET | ORAL | 0 refills | Status: DC

## 2021-09-13 NOTE — Progress Notes (Signed)
  NEUROSURGERY PROGRESS NOTE   Pt seen and examined. No issues overnight. Minimal HA. Unchanged vision. Ambulated yesterday without difficulty. Tolerating diet.  EXAM: Temp:  [97.9 F (36.6 C)-100.1 F (37.8 C)] 98.6 F (37 C) (09/19 0800) Pulse Rate:  [71-113] 75 (09/19 0800) Resp:  [11-29] 24 (09/19 0800) BP: (126-183)/(52-104) 148/64 (09/19 0800) SpO2:  [87 %-97 %] 90 % (09/19 0800) Intake/Output      09/18 0701 09/19 0700 09/19 0701 09/20 0700   I.V. (mL/kg) 1389.7 (16.9)    IV Piggyback 200    Total Intake(mL/kg) 1589.7 (19.4)    Urine (mL/kg/hr) 425 (0.2)    Stool     Total Output 425    Net +1164.7         Urine Occurrence 5 x    Stool Occurrence 1 x     Awake, alert, oriented Speech fluent, no dysarthria MAE well Wound c/d/i  LABS: Lab Results  Component Value Date   CREATININE 0.96 09/08/2021   BUN 18 09/08/2021   NA 141 09/08/2021   K 4.0 09/08/2021   CL 107 09/08/2021   CO2 27 09/08/2021   Lab Results  Component Value Date   WBC 6.3 09/08/2021   HGB 14.0 09/08/2021   HCT 45.9 09/08/2021   MCV 75.4 (L) 09/08/2021   PLT 195 09/08/2021    IMPRESSION: - 86 y.o. male POD#3 s/p right craniotomy for planum meningioma, doing well  PLAN: - can d/c home today   Consuella Lose, MD [facility]

## 2021-09-13 NOTE — Discharge Summary (Signed)
Physician Discharge Summary  Patient ID: CELIO HENDRIKS MRN: [id number] DOB/AGE: [birth year] 86 y.o.  Admit date: 09/10/2021 Discharge date: 09/13/2021  Admission Diagnoses:  Meningioma  Discharge Diagnoses:  Same Active Problems:   Meningioma [facility])   Discharged Condition: Stable  Hospital Course:  Blake Lawson is a 86 y.o. male Admitted electively after uncomplicated right pterional craniotomy for resection of meningioma.  Patient had an uneventful hospital course.  He was at neurologic baseline postoperatively.  MRI demonstrated good resection with expected small residual tumor within the cavernous sinus and possibly the sella.  He was ambulating, tolerating diet, voiding normally, with headache controlled with oral medication.  He therefore requested discharge.  Treatments: Surgery - right pterional craniotomy for resection of tumor  Discharge Exam: Blood pressure (!) 148/64, pulse 75, temperature 98.6 F (37 C), temperature source Oral, resp. rate (!) 24, height '6\' 2"'$  (1.88 m), weight 82.1 kg, SpO2 90 %. Awake, alert, oriented Speech fluent, appropriate CN grossly intact 5/5 BUE/BLE Wound c/d/i  Disposition: Discharge disposition: 01-Home or Self Care       Discharge Instructions     Call MD for:  redness, tenderness, or signs of infection (pain, swelling, redness, odor or green/yellow discharge around incision site)   Complete by: As directed    Call MD for:  temperature >100.4   Complete by: As directed    Diet - low sodium heart healthy   Complete by: As directed    Discharge instructions   Complete by: As directed    Walk at home as much as possible, at least 4 times / day   Increase activity slowly   Complete by: As directed    Lifting restrictions   Complete by: As directed    No lifting > 10 lbs   May shower / Bathe   Complete by: As directed    48 hours after surgery   May walk up steps   Complete by: As directed    No dressing needed    Complete by: As directed    Other Restrictions   Complete by: As directed    No bending/twisting at waist      Allergies as of 09/13/2021   No Known Allergies      Medication List     TAKE these medications    albuterol 108 (90 Base) MCG/ACT inhaler Commonly known as: VENTOLIN HFA Inhale 2 puffs into the lungs every 6 (six) hours as needed for wheezing or shortness of breath.   amLODipine 5 MG tablet Commonly known as: NORVASC Take 5 mg by mouth daily.   apixaban 5 MG Tabs tablet Commonly known as: ELIQUIS Take 1 tablet (5 mg total) by mouth 2 (two) times daily. Start taking on: September 15, 2021 What changed: These instructions start on September 15, 2021. If you are unsure what to do until then, ask your doctor or other care provider.   atorvastatin 40 MG tablet Commonly known as: LIPITOR Take 40 mg by mouth daily.   clotrimazole 1 % cream Commonly known as: LOTRIMIN Apply 1 application topically daily.   empagliflozin 25 MG Tabs tablet Commonly known as: JARDIANCE Take 25 mg by mouth daily.   HumuLIN 70/30 (70-30) 100 UNIT/ML injection Generic drug: insulin NPH-regular Human Inject 25-35 Units into the skin 2 (two) times daily with a meal. Per sliding Scale   HYDROcodone-acetaminophen 5-325 MG tablet Commonly known as: NORCO/VICODIN Take 1 tablet by mouth every 4 (four) hours as needed for moderate pain.  latanoprost 0.005 % ophthalmic solution Commonly known as: XALATAN Place 1 drop into both eyes at bedtime.   levETIRAcetam 500 MG tablet Commonly known as: Keppra Take 1 tablet (500 mg total) by mouth 2 (two) times daily.   losartan 100 MG tablet Commonly known as: COZAAR Take 100 mg by mouth daily.   metFORMIN 500 MG tablet Commonly known as: GLUCOPHAGE Take 1,000 mg by mouth 2 (two) times daily with a meal.   methylPREDNISolone 4 MG Tbpk tablet Commonly known as: MEDROL DOSEPAK Take decreasing dose as directed on package   metoprolol  succinate 100 MG 24 hr tablet Commonly known as: TOPROL-XL Take 50 mg by mouth daily. Take with or immediately following a meal.               Discharge Care Instructions  (From admission, onward)           Start     Ordered   09/13/21 0000  No dressing needed        09/13/21 [id number]            Follow-up Information     Consuella Lose, MD Follow up in 2 week(s).   Specialty: Neurosurgery Contact information: [address] [place] [address] [phone]                 Signed: Jairo Ben 09/13/2021, 9:52 AM

## 2021-09-15 LAB — SURGICAL PATHOLOGY

## 2021-09-16 ENCOUNTER — Ambulatory Visit: Payer: Medicare PPO | Admitting: "Endocrinology

## 2021-09-16 ENCOUNTER — Encounter (HOSPITAL_COMMUNITY): Payer: Self-pay | Admitting: Neurosurgery

## 2021-09-23 DIAGNOSIS — Z794 Long term (current) use of insulin: Secondary | ICD-10-CM | POA: Diagnosis not present

## 2021-09-23 DIAGNOSIS — E119 Type 2 diabetes mellitus without complications: Secondary | ICD-10-CM | POA: Diagnosis not present

## 2021-09-23 DIAGNOSIS — D329 Benign neoplasm of meninges, unspecified: Secondary | ICD-10-CM | POA: Diagnosis not present

## 2021-09-23 DIAGNOSIS — I10 Essential (primary) hypertension: Secondary | ICD-10-CM | POA: Diagnosis not present

## 2021-09-23 DIAGNOSIS — Z7984 Long term (current) use of oral hypoglycemic drugs: Secondary | ICD-10-CM | POA: Diagnosis not present

## 2021-09-23 DIAGNOSIS — Z79891 Long term (current) use of opiate analgesic: Secondary | ICD-10-CM | POA: Diagnosis not present

## 2021-09-23 DIAGNOSIS — Z7901 Long term (current) use of anticoagulants: Secondary | ICD-10-CM | POA: Diagnosis not present

## 2021-09-23 DIAGNOSIS — Z483 Aftercare following surgery for neoplasm: Secondary | ICD-10-CM | POA: Diagnosis not present

## 2021-09-24 DIAGNOSIS — D329 Benign neoplasm of meninges, unspecified: Secondary | ICD-10-CM | POA: Diagnosis not present

## 2021-09-24 DIAGNOSIS — Z79891 Long term (current) use of opiate analgesic: Secondary | ICD-10-CM | POA: Diagnosis not present

## 2021-09-24 DIAGNOSIS — Z794 Long term (current) use of insulin: Secondary | ICD-10-CM | POA: Diagnosis not present

## 2021-09-24 DIAGNOSIS — Z483 Aftercare following surgery for neoplasm: Secondary | ICD-10-CM | POA: Diagnosis not present

## 2021-09-24 DIAGNOSIS — I10 Essential (primary) hypertension: Secondary | ICD-10-CM | POA: Diagnosis not present

## 2021-09-24 DIAGNOSIS — Z7901 Long term (current) use of anticoagulants: Secondary | ICD-10-CM | POA: Diagnosis not present

## 2021-09-24 DIAGNOSIS — Z7984 Long term (current) use of oral hypoglycemic drugs: Secondary | ICD-10-CM | POA: Diagnosis not present

## 2021-09-24 DIAGNOSIS — E119 Type 2 diabetes mellitus without complications: Secondary | ICD-10-CM | POA: Diagnosis not present

## 2021-09-27 ENCOUNTER — Other Ambulatory Visit: Payer: Self-pay | Admitting: Radiation Therapy

## 2021-09-28 DIAGNOSIS — Z794 Long term (current) use of insulin: Secondary | ICD-10-CM | POA: Diagnosis not present

## 2021-09-28 DIAGNOSIS — Z483 Aftercare following surgery for neoplasm: Secondary | ICD-10-CM | POA: Diagnosis not present

## 2021-09-28 DIAGNOSIS — Z79891 Long term (current) use of opiate analgesic: Secondary | ICD-10-CM | POA: Diagnosis not present

## 2021-09-28 DIAGNOSIS — Z7901 Long term (current) use of anticoagulants: Secondary | ICD-10-CM | POA: Diagnosis not present

## 2021-09-28 DIAGNOSIS — D329 Benign neoplasm of meninges, unspecified: Secondary | ICD-10-CM | POA: Diagnosis not present

## 2021-09-28 DIAGNOSIS — I10 Essential (primary) hypertension: Secondary | ICD-10-CM | POA: Diagnosis not present

## 2021-09-28 DIAGNOSIS — Z7984 Long term (current) use of oral hypoglycemic drugs: Secondary | ICD-10-CM | POA: Diagnosis not present

## 2021-09-28 DIAGNOSIS — E119 Type 2 diabetes mellitus without complications: Secondary | ICD-10-CM | POA: Diagnosis not present

## 2021-09-29 DIAGNOSIS — E119 Type 2 diabetes mellitus without complications: Secondary | ICD-10-CM | POA: Diagnosis not present

## 2021-09-29 DIAGNOSIS — Z483 Aftercare following surgery for neoplasm: Secondary | ICD-10-CM | POA: Diagnosis not present

## 2021-09-29 DIAGNOSIS — I10 Essential (primary) hypertension: Secondary | ICD-10-CM | POA: Diagnosis not present

## 2021-09-29 DIAGNOSIS — D329 Benign neoplasm of meninges, unspecified: Secondary | ICD-10-CM | POA: Diagnosis not present

## 2021-09-29 DIAGNOSIS — Z7901 Long term (current) use of anticoagulants: Secondary | ICD-10-CM | POA: Diagnosis not present

## 2021-09-29 DIAGNOSIS — Z7984 Long term (current) use of oral hypoglycemic drugs: Secondary | ICD-10-CM | POA: Diagnosis not present

## 2021-09-29 DIAGNOSIS — Z79891 Long term (current) use of opiate analgesic: Secondary | ICD-10-CM | POA: Diagnosis not present

## 2021-09-29 DIAGNOSIS — Z794 Long term (current) use of insulin: Secondary | ICD-10-CM | POA: Diagnosis not present

## 2021-09-30 DIAGNOSIS — Z483 Aftercare following surgery for neoplasm: Secondary | ICD-10-CM | POA: Diagnosis not present

## 2021-09-30 DIAGNOSIS — I10 Essential (primary) hypertension: Secondary | ICD-10-CM | POA: Diagnosis not present

## 2021-09-30 DIAGNOSIS — Z79891 Long term (current) use of opiate analgesic: Secondary | ICD-10-CM | POA: Diagnosis not present

## 2021-09-30 DIAGNOSIS — D329 Benign neoplasm of meninges, unspecified: Secondary | ICD-10-CM | POA: Diagnosis not present

## 2021-09-30 DIAGNOSIS — E119 Type 2 diabetes mellitus without complications: Secondary | ICD-10-CM | POA: Diagnosis not present

## 2021-09-30 DIAGNOSIS — Z7901 Long term (current) use of anticoagulants: Secondary | ICD-10-CM | POA: Diagnosis not present

## 2021-09-30 DIAGNOSIS — Z7984 Long term (current) use of oral hypoglycemic drugs: Secondary | ICD-10-CM | POA: Diagnosis not present

## 2021-09-30 DIAGNOSIS — Z794 Long term (current) use of insulin: Secondary | ICD-10-CM | POA: Diagnosis not present

## 2021-10-01 DIAGNOSIS — Z7984 Long term (current) use of oral hypoglycemic drugs: Secondary | ICD-10-CM | POA: Diagnosis not present

## 2021-10-01 DIAGNOSIS — I10 Essential (primary) hypertension: Secondary | ICD-10-CM | POA: Diagnosis not present

## 2021-10-01 DIAGNOSIS — Z794 Long term (current) use of insulin: Secondary | ICD-10-CM | POA: Diagnosis not present

## 2021-10-01 DIAGNOSIS — E119 Type 2 diabetes mellitus without complications: Secondary | ICD-10-CM | POA: Diagnosis not present

## 2021-10-01 DIAGNOSIS — Z483 Aftercare following surgery for neoplasm: Secondary | ICD-10-CM | POA: Diagnosis not present

## 2021-10-01 DIAGNOSIS — Z7901 Long term (current) use of anticoagulants: Secondary | ICD-10-CM | POA: Diagnosis not present

## 2021-10-01 DIAGNOSIS — D329 Benign neoplasm of meninges, unspecified: Secondary | ICD-10-CM | POA: Diagnosis not present

## 2021-10-01 DIAGNOSIS — Z79891 Long term (current) use of opiate analgesic: Secondary | ICD-10-CM | POA: Diagnosis not present

## 2021-10-04 ENCOUNTER — Inpatient Hospital Stay: Payer: Medicare PPO | Attending: Neurosurgery

## 2021-10-05 DIAGNOSIS — Z794 Long term (current) use of insulin: Secondary | ICD-10-CM | POA: Diagnosis not present

## 2021-10-05 DIAGNOSIS — Z79891 Long term (current) use of opiate analgesic: Secondary | ICD-10-CM | POA: Diagnosis not present

## 2021-10-05 DIAGNOSIS — E119 Type 2 diabetes mellitus without complications: Secondary | ICD-10-CM | POA: Diagnosis not present

## 2021-10-05 DIAGNOSIS — Z483 Aftercare following surgery for neoplasm: Secondary | ICD-10-CM | POA: Diagnosis not present

## 2021-10-05 DIAGNOSIS — Z7984 Long term (current) use of oral hypoglycemic drugs: Secondary | ICD-10-CM | POA: Diagnosis not present

## 2021-10-05 DIAGNOSIS — D329 Benign neoplasm of meninges, unspecified: Secondary | ICD-10-CM | POA: Diagnosis not present

## 2021-10-05 DIAGNOSIS — Z7901 Long term (current) use of anticoagulants: Secondary | ICD-10-CM | POA: Diagnosis not present

## 2021-10-05 DIAGNOSIS — I10 Essential (primary) hypertension: Secondary | ICD-10-CM | POA: Diagnosis not present

## 2021-10-06 DIAGNOSIS — E119 Type 2 diabetes mellitus without complications: Secondary | ICD-10-CM | POA: Diagnosis not present

## 2021-10-06 DIAGNOSIS — I10 Essential (primary) hypertension: Secondary | ICD-10-CM | POA: Diagnosis not present

## 2021-10-06 DIAGNOSIS — Z483 Aftercare following surgery for neoplasm: Secondary | ICD-10-CM | POA: Diagnosis not present

## 2021-10-06 DIAGNOSIS — Z79891 Long term (current) use of opiate analgesic: Secondary | ICD-10-CM | POA: Diagnosis not present

## 2021-10-06 DIAGNOSIS — Z794 Long term (current) use of insulin: Secondary | ICD-10-CM | POA: Diagnosis not present

## 2021-10-06 DIAGNOSIS — Z7901 Long term (current) use of anticoagulants: Secondary | ICD-10-CM | POA: Diagnosis not present

## 2021-10-06 DIAGNOSIS — Z7984 Long term (current) use of oral hypoglycemic drugs: Secondary | ICD-10-CM | POA: Diagnosis not present

## 2021-10-06 DIAGNOSIS — D329 Benign neoplasm of meninges, unspecified: Secondary | ICD-10-CM | POA: Diagnosis not present

## 2021-10-11 DIAGNOSIS — D329 Benign neoplasm of meninges, unspecified: Secondary | ICD-10-CM | POA: Diagnosis not present

## 2021-10-11 DIAGNOSIS — Z7984 Long term (current) use of oral hypoglycemic drugs: Secondary | ICD-10-CM | POA: Diagnosis not present

## 2021-10-11 DIAGNOSIS — E119 Type 2 diabetes mellitus without complications: Secondary | ICD-10-CM | POA: Diagnosis not present

## 2021-10-11 DIAGNOSIS — Z794 Long term (current) use of insulin: Secondary | ICD-10-CM | POA: Diagnosis not present

## 2021-10-11 DIAGNOSIS — Z79891 Long term (current) use of opiate analgesic: Secondary | ICD-10-CM | POA: Diagnosis not present

## 2021-10-11 DIAGNOSIS — Z483 Aftercare following surgery for neoplasm: Secondary | ICD-10-CM | POA: Diagnosis not present

## 2021-10-11 DIAGNOSIS — Z7901 Long term (current) use of anticoagulants: Secondary | ICD-10-CM | POA: Diagnosis not present

## 2021-10-11 DIAGNOSIS — I10 Essential (primary) hypertension: Secondary | ICD-10-CM | POA: Diagnosis not present

## 2021-10-13 DIAGNOSIS — Z483 Aftercare following surgery for neoplasm: Secondary | ICD-10-CM | POA: Diagnosis not present

## 2021-10-13 DIAGNOSIS — Z7901 Long term (current) use of anticoagulants: Secondary | ICD-10-CM | POA: Diagnosis not present

## 2021-10-13 DIAGNOSIS — D329 Benign neoplasm of meninges, unspecified: Secondary | ICD-10-CM | POA: Diagnosis not present

## 2021-10-13 DIAGNOSIS — Z794 Long term (current) use of insulin: Secondary | ICD-10-CM | POA: Diagnosis not present

## 2021-10-13 DIAGNOSIS — Z79891 Long term (current) use of opiate analgesic: Secondary | ICD-10-CM | POA: Diagnosis not present

## 2021-10-13 DIAGNOSIS — E119 Type 2 diabetes mellitus without complications: Secondary | ICD-10-CM | POA: Diagnosis not present

## 2021-10-13 DIAGNOSIS — I10 Essential (primary) hypertension: Secondary | ICD-10-CM | POA: Diagnosis not present

## 2021-10-13 DIAGNOSIS — Z7984 Long term (current) use of oral hypoglycemic drugs: Secondary | ICD-10-CM | POA: Diagnosis not present

## 2021-10-21 DIAGNOSIS — Z794 Long term (current) use of insulin: Secondary | ICD-10-CM | POA: Diagnosis not present

## 2021-10-21 DIAGNOSIS — Z7984 Long term (current) use of oral hypoglycemic drugs: Secondary | ICD-10-CM | POA: Diagnosis not present

## 2021-10-21 DIAGNOSIS — Z79891 Long term (current) use of opiate analgesic: Secondary | ICD-10-CM | POA: Diagnosis not present

## 2021-10-21 DIAGNOSIS — I10 Essential (primary) hypertension: Secondary | ICD-10-CM | POA: Diagnosis not present

## 2021-10-21 DIAGNOSIS — D329 Benign neoplasm of meninges, unspecified: Secondary | ICD-10-CM | POA: Diagnosis not present

## 2021-10-21 DIAGNOSIS — Z483 Aftercare following surgery for neoplasm: Secondary | ICD-10-CM | POA: Diagnosis not present

## 2021-10-21 DIAGNOSIS — E119 Type 2 diabetes mellitus without complications: Secondary | ICD-10-CM | POA: Diagnosis not present

## 2021-10-21 DIAGNOSIS — Z7901 Long term (current) use of anticoagulants: Secondary | ICD-10-CM | POA: Diagnosis not present

## 2021-10-26 DIAGNOSIS — Z794 Long term (current) use of insulin: Secondary | ICD-10-CM | POA: Diagnosis not present

## 2021-10-26 DIAGNOSIS — Z7984 Long term (current) use of oral hypoglycemic drugs: Secondary | ICD-10-CM | POA: Diagnosis not present

## 2021-10-26 DIAGNOSIS — Z483 Aftercare following surgery for neoplasm: Secondary | ICD-10-CM | POA: Diagnosis not present

## 2021-10-26 DIAGNOSIS — D329 Benign neoplasm of meninges, unspecified: Secondary | ICD-10-CM | POA: Diagnosis not present

## 2021-10-26 DIAGNOSIS — Z79891 Long term (current) use of opiate analgesic: Secondary | ICD-10-CM | POA: Diagnosis not present

## 2021-10-26 DIAGNOSIS — I10 Essential (primary) hypertension: Secondary | ICD-10-CM | POA: Diagnosis not present

## 2021-10-26 DIAGNOSIS — E119 Type 2 diabetes mellitus without complications: Secondary | ICD-10-CM | POA: Diagnosis not present

## 2021-10-26 DIAGNOSIS — Z7901 Long term (current) use of anticoagulants: Secondary | ICD-10-CM | POA: Diagnosis not present

## 2021-11-03 DIAGNOSIS — Z794 Long term (current) use of insulin: Secondary | ICD-10-CM | POA: Diagnosis not present

## 2021-11-03 DIAGNOSIS — Z79891 Long term (current) use of opiate analgesic: Secondary | ICD-10-CM | POA: Diagnosis not present

## 2021-11-03 DIAGNOSIS — Z7984 Long term (current) use of oral hypoglycemic drugs: Secondary | ICD-10-CM | POA: Diagnosis not present

## 2021-11-03 DIAGNOSIS — E119 Type 2 diabetes mellitus without complications: Secondary | ICD-10-CM | POA: Diagnosis not present

## 2021-11-03 DIAGNOSIS — I10 Essential (primary) hypertension: Secondary | ICD-10-CM | POA: Diagnosis not present

## 2021-11-03 DIAGNOSIS — D329 Benign neoplasm of meninges, unspecified: Secondary | ICD-10-CM | POA: Diagnosis not present

## 2021-11-03 DIAGNOSIS — Z7901 Long term (current) use of anticoagulants: Secondary | ICD-10-CM | POA: Diagnosis not present

## 2021-11-03 DIAGNOSIS — Z483 Aftercare following surgery for neoplasm: Secondary | ICD-10-CM | POA: Diagnosis not present

## 2021-11-30 DIAGNOSIS — Z299 Encounter for prophylactic measures, unspecified: Secondary | ICD-10-CM | POA: Diagnosis not present

## 2021-11-30 DIAGNOSIS — I10 Essential (primary) hypertension: Secondary | ICD-10-CM | POA: Diagnosis not present

## 2021-11-30 DIAGNOSIS — E1165 Type 2 diabetes mellitus with hyperglycemia: Secondary | ICD-10-CM | POA: Diagnosis not present

## 2021-11-30 DIAGNOSIS — Z794 Long term (current) use of insulin: Secondary | ICD-10-CM | POA: Diagnosis not present

## 2021-12-06 DIAGNOSIS — H6691 Otitis media, unspecified, right ear: Secondary | ICD-10-CM | POA: Diagnosis not present

## 2021-12-06 DIAGNOSIS — Z6825 Body mass index (BMI) 25.0-25.9, adult: Secondary | ICD-10-CM | POA: Diagnosis not present

## 2021-12-06 DIAGNOSIS — Z299 Encounter for prophylactic measures, unspecified: Secondary | ICD-10-CM | POA: Diagnosis not present

## 2021-12-06 DIAGNOSIS — I10 Essential (primary) hypertension: Secondary | ICD-10-CM | POA: Diagnosis not present

## 2021-12-06 DIAGNOSIS — Z713 Dietary counseling and surveillance: Secondary | ICD-10-CM | POA: Diagnosis not present

## 2021-12-14 DIAGNOSIS — M25511 Pain in right shoulder: Secondary | ICD-10-CM | POA: Diagnosis not present

## 2021-12-14 DIAGNOSIS — M25512 Pain in left shoulder: Secondary | ICD-10-CM | POA: Diagnosis not present

## 2021-12-14 DIAGNOSIS — E1165 Type 2 diabetes mellitus with hyperglycemia: Secondary | ICD-10-CM | POA: Diagnosis not present

## 2021-12-14 DIAGNOSIS — I272 Pulmonary hypertension, unspecified: Secondary | ICD-10-CM | POA: Diagnosis not present

## 2021-12-14 DIAGNOSIS — Z299 Encounter for prophylactic measures, unspecified: Secondary | ICD-10-CM | POA: Diagnosis not present

## 2021-12-17 ENCOUNTER — Other Ambulatory Visit (HOSPITAL_COMMUNITY): Payer: Self-pay | Admitting: Neurosurgery

## 2021-12-17 ENCOUNTER — Other Ambulatory Visit: Payer: Self-pay | Admitting: Neurosurgery

## 2021-12-17 DIAGNOSIS — D329 Benign neoplasm of meninges, unspecified: Secondary | ICD-10-CM

## 2022-01-06 ENCOUNTER — Other Ambulatory Visit: Payer: Self-pay

## 2022-01-06 ENCOUNTER — Ambulatory Visit (HOSPITAL_COMMUNITY)
Admission: RE | Admit: 2022-01-06 | Discharge: 2022-01-06 | Disposition: A | Discharge status: Home / Self Care | Payer: Medicare PPO | Source: Ambulatory Visit | Attending: Neurosurgery | Admitting: Neurosurgery

## 2022-01-06 DIAGNOSIS — D329 Benign neoplasm of meninges, unspecified: Secondary | ICD-10-CM | POA: Insufficient documentation

## 2022-01-06 IMAGING — MR MR HEAD WO/W CM
13 of 15 series · 38 of 48 positions shown · IV contrast (8 ml Gadavist)
Comparison: [DATE]
COMPARISON: [DATE]

Addendum:
CLINICAL DATA: Meningioma post resection, follow-up

EXAM:
MRI HEAD WITHOUT AND WITH CONTRAST
TECHNIQUE: Multiplanar, multiecho pulse sequences of the brain and surrounding
structures were obtained without and with intravenous contrast.
CONTRAST:  8mL GADAVIST GADOBUTROL 1 MMOL/ML IV SOLN

[Series 5: DWI · axial · 4.0mm · 0.88mm/px · z∈[-48,+95]mm · 3 of 38 slices shown (1 of 6)]
[im 1/38]
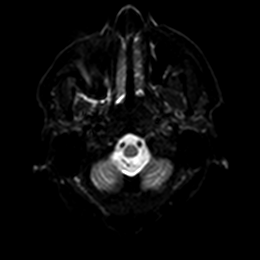
[im 19/38]
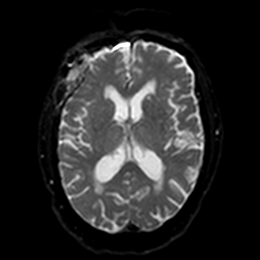
[im 38/38]
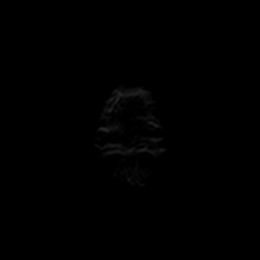

[Series 5: DWI · axial · 4.0mm · 0.88mm/px · z∈[-48,+95]mm · 4 of 38 slices shown (2 of 6)]
[im 1/38]
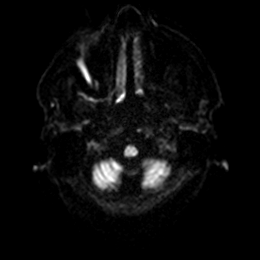
[im 13/38]
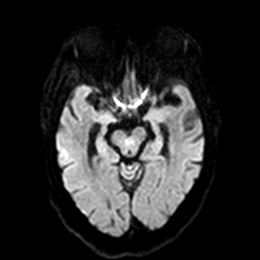
[im 25/38]
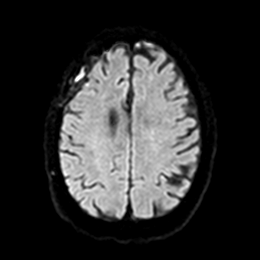
[im 38/38]
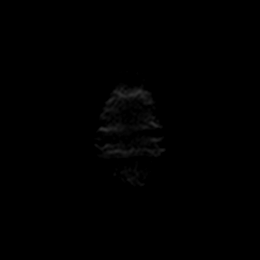

[Series 6: DWI · axial · 4.0mm · 0.88mm/px · z∈[-48,+95]mm · 4 of 38 slices shown (3 of 6)]
[im 1/38]
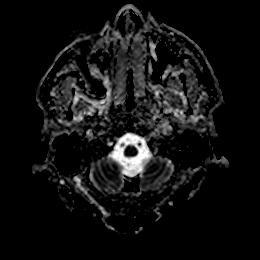
[im 13/38]
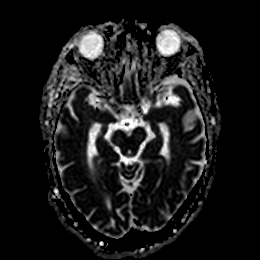
[im 25/38]
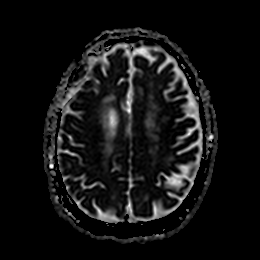
[im 38/38]
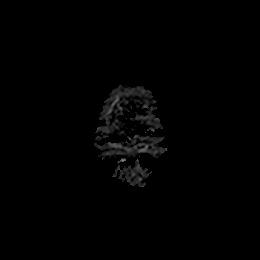

[Series 7: DWI · coronal · 5.0mm · 0.88mm/px · 3 of 31 slices shown (4 of 6)]
[im 1/31]
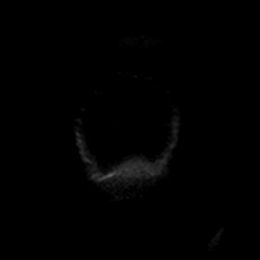
[im 16/31]
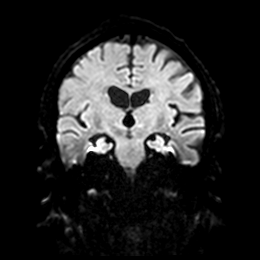
[im 31/31]
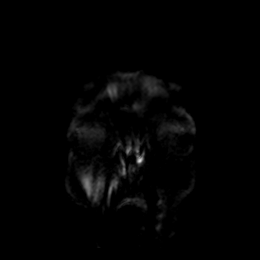

[Series 7: DWI · coronal · 5.0mm · 0.88mm/px · 3 of 31 slices shown (5 of 6)]
[im 1/31]
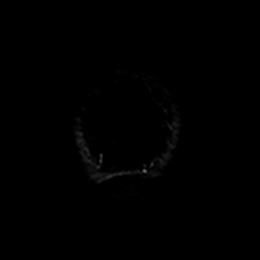
[im 16/31]
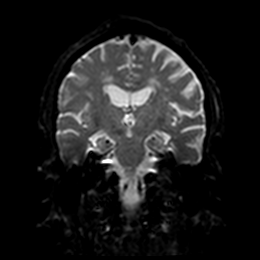
[im 31/31]
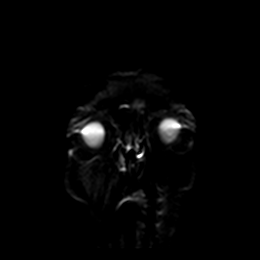

[Series 8: DWI · coronal · 5.0mm · 0.88mm/px · 3 of 31 slices shown (6 of 6)]
[im 1/31]
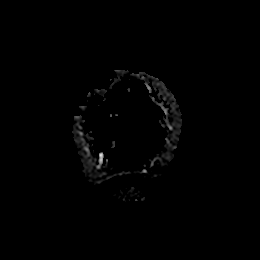
[im 16/31]
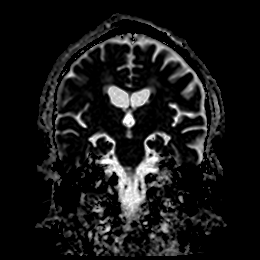
[im 31/31]
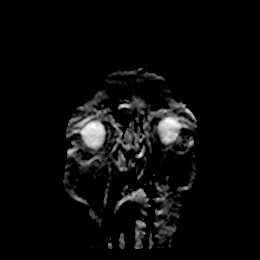

[Series 9: T1 · sagittal · 5.0mm · 0.94mm/px · 2 of 23 slices shown (1 of 2)]
[im 1/23]
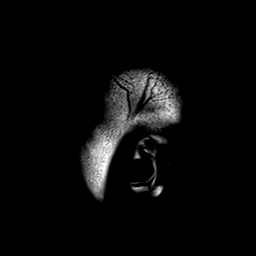
[im 23/23]
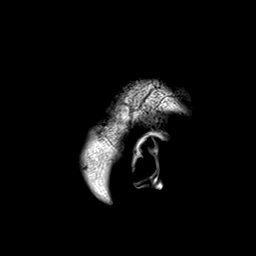

[Series 10: T2 · axial · 5.0mm · 0.72mm/px · z∈[-47,+95]mm · 2 of 22 slices shown (1 of 2)]
[im 1/22]
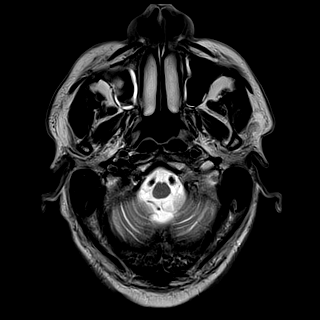
[im 22/22]
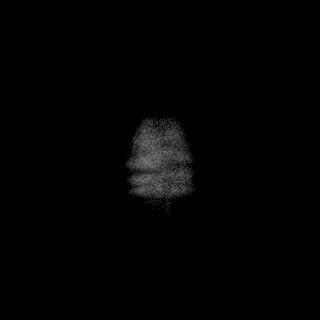

[Series 11: ax hemo · axial · 5.0mm · 0.86mm/px · z∈[-51,+94]mm · 2 of 26 slices shown]
[im 1/26]
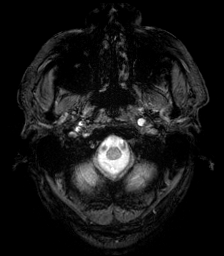
[im 26/26]
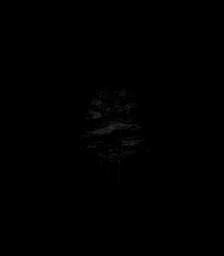

[Series 12: FLAIR · axial · 4.0mm · 0.43mm/px · z∈[-48,+91]mm · 4 of 37 slices shown]
[im 1/37]
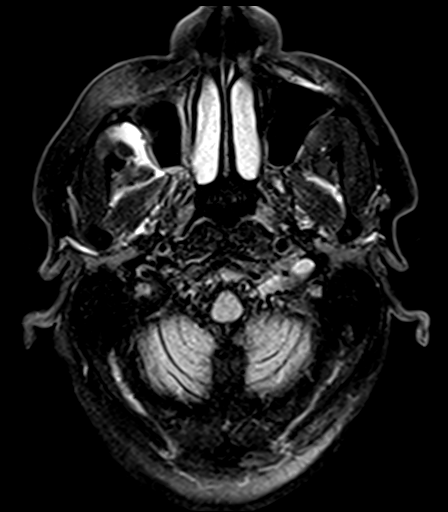
[im 13/37]
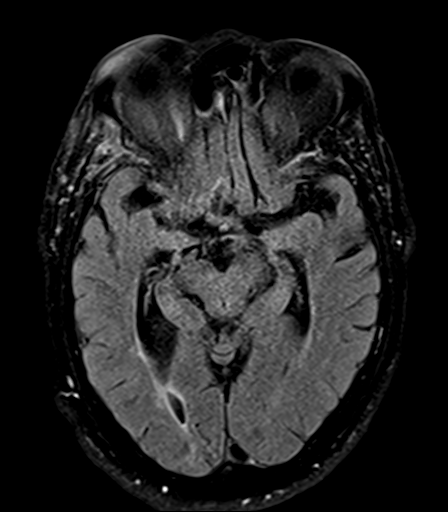
[im 25/37]
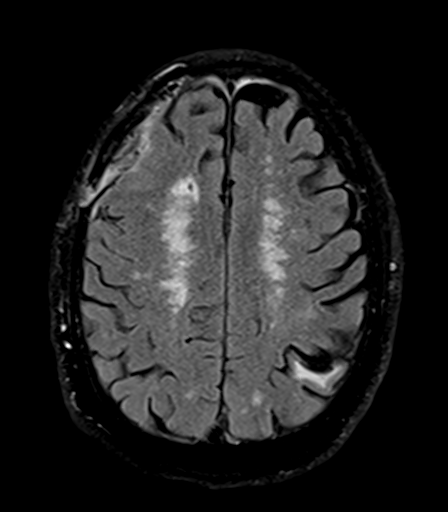
[im 37/37]
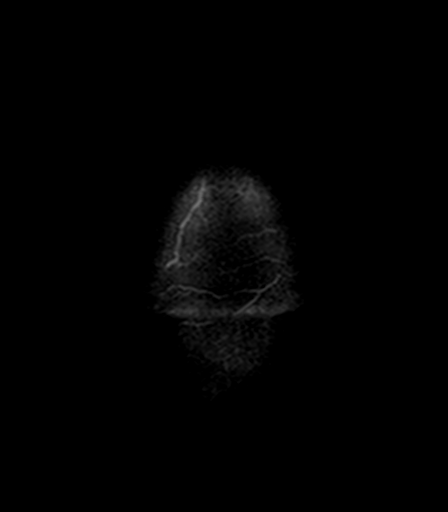

[Series 18: T2 · coronal · 5.0mm · 0.72mm/px · 3 of 30 slices shown (2 of 2)]
[im 1/30]
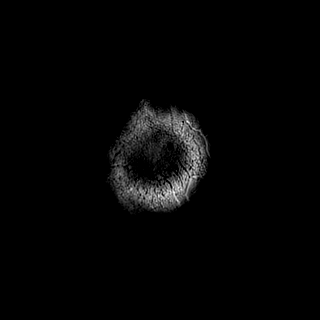
[im 15/30]
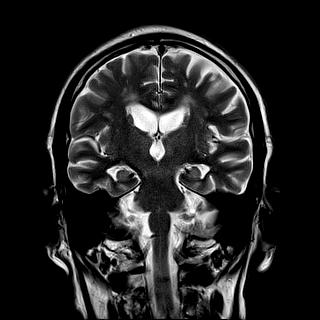
[im 30/30]
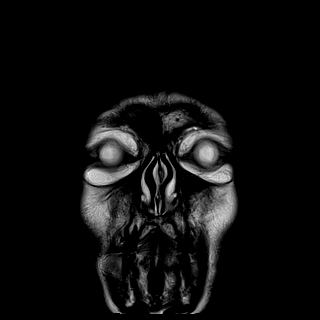

[Series 20: T1 post-contrast · coronal · 5.0mm · 0.34mm/px · 3 of 31 slices shown]
[im 1/31]
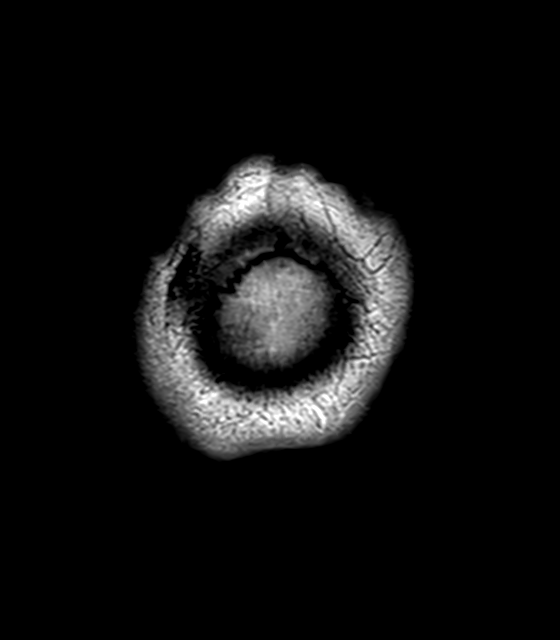
[im 16/31]
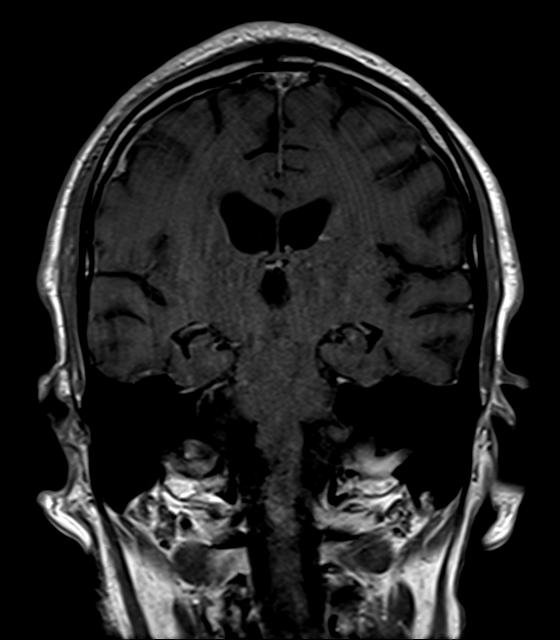
[im 31/31]
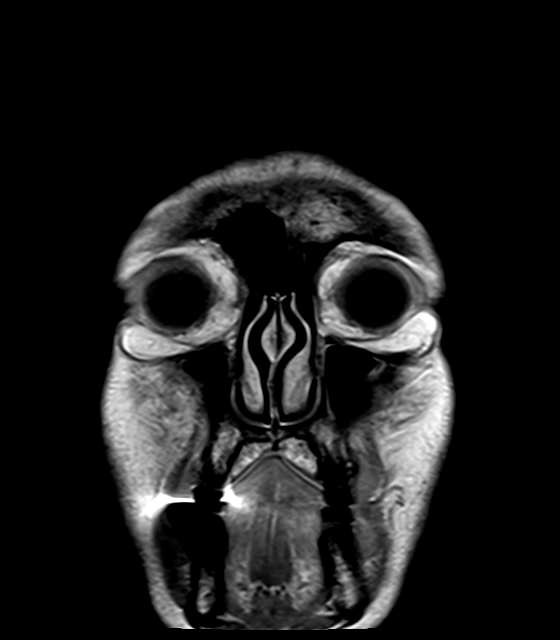

[Series 21: T1 · sagittal · 5.0mm · 0.94mm/px · 2 of 23 slices shown (2 of 2)]
[im 1/23]
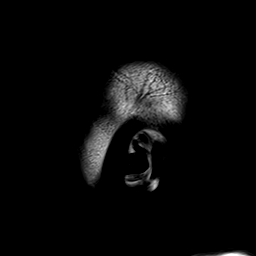
[im 23/23]
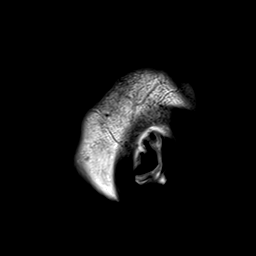

[38 of 48 positions shown; findings below may reference images not displayed]

FINDINGS: Motion artifact is present.

Brain: Evolution of immediate postoperative changes. There remains a
postoperative extra-axial collection underlying the right frontal
craniotomy measuring up to 1.2 cm in thickness. This extends
partially along the interhemispheric fissure. There is also a trace
extra-axial collection along the anterior left frontal convexity. No
significant mass effect.

There is likely postoperative extra-axial enhancement. Suspect small
foci of residual tumor superolateral to the right paraclinoid ICA
(series 20, image 21) and between the proximal and distal cavernous
ICA (series 20, image 20).

No acute infarction or hemorrhage. Chronic left frontoparietal
infarcts. Patchy and confluent T2 hyperintensity in the
supratentorial white matter reflects chronic microvascular ischemic
changes. Prominence of the ventricles and sulci reflects parenchymal
volume loss.

Vascular: Major vessel flow voids at the skull base are preserved.

Skull and upper cervical spine: Right frontal craniotomy. Normal
marrow signal is preserved.

Sinuses/Orbits: Minor mucosal thickening. Bilateral lens
replacements.

Other: Sella is unremarkable.  Mastoid air cells are clear.
IMPRESSION: Two possible small foci of residual tumor. Tissue superolateral to
the right paraclinoid ICA could reflect enhancement about the right
optic nerve. Attention on follow-up is recommended at which time MRI
of the orbits would be a better study to evaluate this region.

Residual extra-axial collections.  No significant mass effect.

ADDENDUM:
Not included in the initial report, stable meningioma along the left
greater sphenoid wing.

*** End of Addendum ***
FINDINGS: Motion artifact is present.

Brain: Evolution of immediate postoperative changes. There remains a
postoperative extra-axial collection underlying the right frontal
craniotomy measuring up to 1.2 cm in thickness. This extends
partially along the interhemispheric fissure. There is also a trace
extra-axial collection along the anterior left frontal convexity. No
significant mass effect.

There is likely postoperative extra-axial enhancement. Suspect small
foci of residual tumor superolateral to the right paraclinoid ICA
(series 20, image 21) and between the proximal and distal cavernous
ICA (series 20, image 20).

No acute infarction or hemorrhage. Chronic left frontoparietal
infarcts. Patchy and confluent T2 hyperintensity in the
supratentorial white matter reflects chronic microvascular ischemic
changes. Prominence of the ventricles and sulci reflects parenchymal
volume loss.

Vascular: Major vessel flow voids at the skull base are preserved.

Skull and upper cervical spine: Right frontal craniotomy. Normal
marrow signal is preserved.

Sinuses/Orbits: Minor mucosal thickening. Bilateral lens
replacements.

Other: Sella is unremarkable.  Mastoid air cells are clear.
IMPRESSION: Two possible small foci of residual tumor. Tissue superolateral to
the right paraclinoid ICA could reflect enhancement about the right
optic nerve. Attention on follow-up is recommended at which time MRI
of the orbits would be a better study to evaluate this region.

Residual extra-axial collections.  No significant mass effect.

## 2022-01-06 MED ORDER — GADOBUTROL 1 MMOL/ML IV SOLN
8.0000 mL | Freq: Once | INTRAVENOUS | Status: AC | PRN
  Administered 2022-01-06: 8 mL via INTRAVENOUS

## 2022-01-24 DIAGNOSIS — I10 Essential (primary) hypertension: Secondary | ICD-10-CM | POA: Diagnosis not present

## 2022-01-24 DIAGNOSIS — D329 Benign neoplasm of meninges, unspecified: Secondary | ICD-10-CM | POA: Diagnosis not present

## 2022-01-25 DIAGNOSIS — Z299 Encounter for prophylactic measures, unspecified: Secondary | ICD-10-CM | POA: Diagnosis not present

## 2022-01-25 DIAGNOSIS — E1165 Type 2 diabetes mellitus with hyperglycemia: Secondary | ICD-10-CM | POA: Diagnosis not present

## 2022-01-25 DIAGNOSIS — I10 Essential (primary) hypertension: Secondary | ICD-10-CM | POA: Diagnosis not present

## 2022-01-25 DIAGNOSIS — B353 Tinea pedis: Secondary | ICD-10-CM | POA: Diagnosis not present

## 2022-01-25 DIAGNOSIS — I82409 Acute embolism and thrombosis of unspecified deep veins of unspecified lower extremity: Secondary | ICD-10-CM | POA: Diagnosis not present

## 2022-01-25 DIAGNOSIS — Z6825 Body mass index (BMI) 25.0-25.9, adult: Secondary | ICD-10-CM | POA: Diagnosis not present

## 2022-01-25 DIAGNOSIS — Z87891 Personal history of nicotine dependence: Secondary | ICD-10-CM | POA: Diagnosis not present

## 2022-02-07 DIAGNOSIS — H401131 Primary open-angle glaucoma, bilateral, mild stage: Secondary | ICD-10-CM | POA: Diagnosis not present

## 2022-02-23 DIAGNOSIS — Z299 Encounter for prophylactic measures, unspecified: Secondary | ICD-10-CM | POA: Diagnosis not present

## 2022-02-23 DIAGNOSIS — Z87891 Personal history of nicotine dependence: Secondary | ICD-10-CM | POA: Diagnosis not present

## 2022-02-23 DIAGNOSIS — Z0000 Encounter for general adult medical examination without abnormal findings: Secondary | ICD-10-CM | POA: Diagnosis not present

## 2022-02-23 DIAGNOSIS — I10 Essential (primary) hypertension: Secondary | ICD-10-CM | POA: Diagnosis not present

## 2022-02-23 DIAGNOSIS — Z6828 Body mass index (BMI) 28.0-28.9, adult: Secondary | ICD-10-CM | POA: Diagnosis not present

## 2022-02-23 DIAGNOSIS — Z7189 Other specified counseling: Secondary | ICD-10-CM | POA: Diagnosis not present

## 2022-02-23 DIAGNOSIS — Z1331 Encounter for screening for depression: Secondary | ICD-10-CM | POA: Diagnosis not present

## 2022-02-23 DIAGNOSIS — Z1339 Encounter for screening examination for other mental health and behavioral disorders: Secondary | ICD-10-CM | POA: Diagnosis not present

## 2022-03-11 ENCOUNTER — Other Ambulatory Visit (HOSPITAL_COMMUNITY): Payer: Self-pay | Admitting: Neurosurgery

## 2022-03-11 ENCOUNTER — Other Ambulatory Visit: Payer: Self-pay | Admitting: Neurosurgery

## 2022-03-11 DIAGNOSIS — D329 Benign neoplasm of meninges, unspecified: Secondary | ICD-10-CM

## 2022-03-28 DIAGNOSIS — I10 Essential (primary) hypertension: Secondary | ICD-10-CM | POA: Diagnosis not present

## 2022-03-28 DIAGNOSIS — Z794 Long term (current) use of insulin: Secondary | ICD-10-CM | POA: Diagnosis not present

## 2022-03-28 DIAGNOSIS — E1165 Type 2 diabetes mellitus with hyperglycemia: Secondary | ICD-10-CM | POA: Diagnosis not present

## 2022-03-28 DIAGNOSIS — Z299 Encounter for prophylactic measures, unspecified: Secondary | ICD-10-CM | POA: Diagnosis not present

## 2022-04-28 DIAGNOSIS — Z299 Encounter for prophylactic measures, unspecified: Secondary | ICD-10-CM | POA: Diagnosis not present

## 2022-04-28 DIAGNOSIS — E1165 Type 2 diabetes mellitus with hyperglycemia: Secondary | ICD-10-CM | POA: Diagnosis not present

## 2022-04-28 DIAGNOSIS — Z794 Long term (current) use of insulin: Secondary | ICD-10-CM | POA: Diagnosis not present

## 2022-04-28 DIAGNOSIS — I10 Essential (primary) hypertension: Secondary | ICD-10-CM | POA: Diagnosis not present

## 2022-06-16 DIAGNOSIS — H04123 Dry eye syndrome of bilateral lacrimal glands: Secondary | ICD-10-CM | POA: Diagnosis not present

## 2022-06-16 DIAGNOSIS — H5700 Unspecified anomaly of pupillary function: Secondary | ICD-10-CM | POA: Diagnosis not present

## 2022-06-16 DIAGNOSIS — Z961 Presence of intraocular lens: Secondary | ICD-10-CM | POA: Diagnosis not present

## 2022-06-16 DIAGNOSIS — H401131 Primary open-angle glaucoma, bilateral, mild stage: Secondary | ICD-10-CM | POA: Diagnosis not present

## 2022-06-30 DIAGNOSIS — Z299 Encounter for prophylactic measures, unspecified: Secondary | ICD-10-CM | POA: Diagnosis not present

## 2022-06-30 DIAGNOSIS — I272 Pulmonary hypertension, unspecified: Secondary | ICD-10-CM | POA: Diagnosis not present

## 2022-06-30 DIAGNOSIS — I10 Essential (primary) hypertension: Secondary | ICD-10-CM | POA: Diagnosis not present

## 2022-06-30 DIAGNOSIS — D6869 Other thrombophilia: Secondary | ICD-10-CM | POA: Diagnosis not present

## 2022-06-30 DIAGNOSIS — D320 Benign neoplasm of cerebral meninges: Secondary | ICD-10-CM | POA: Diagnosis not present

## 2022-06-30 DIAGNOSIS — Z6826 Body mass index (BMI) 26.0-26.9, adult: Secondary | ICD-10-CM | POA: Diagnosis not present

## 2022-06-30 DIAGNOSIS — E1165 Type 2 diabetes mellitus with hyperglycemia: Secondary | ICD-10-CM | POA: Diagnosis not present

## 2022-06-30 DIAGNOSIS — Z0000 Encounter for general adult medical examination without abnormal findings: Secondary | ICD-10-CM | POA: Diagnosis not present

## 2022-08-10 DIAGNOSIS — I10 Essential (primary) hypertension: Secondary | ICD-10-CM | POA: Diagnosis not present

## 2022-08-10 DIAGNOSIS — J302 Other seasonal allergic rhinitis: Secondary | ICD-10-CM | POA: Diagnosis not present

## 2022-08-10 DIAGNOSIS — J069 Acute upper respiratory infection, unspecified: Secondary | ICD-10-CM | POA: Diagnosis not present

## 2022-08-10 DIAGNOSIS — Z299 Encounter for prophylactic measures, unspecified: Secondary | ICD-10-CM | POA: Diagnosis not present

## 2022-10-04 DIAGNOSIS — I272 Pulmonary hypertension, unspecified: Secondary | ICD-10-CM | POA: Diagnosis not present

## 2022-10-04 DIAGNOSIS — Z299 Encounter for prophylactic measures, unspecified: Secondary | ICD-10-CM | POA: Diagnosis not present

## 2022-10-04 DIAGNOSIS — Z23 Encounter for immunization: Secondary | ICD-10-CM | POA: Diagnosis not present

## 2022-10-04 DIAGNOSIS — Z6826 Body mass index (BMI) 26.0-26.9, adult: Secondary | ICD-10-CM | POA: Diagnosis not present

## 2022-10-04 DIAGNOSIS — Z794 Long term (current) use of insulin: Secondary | ICD-10-CM | POA: Diagnosis not present

## 2022-10-04 DIAGNOSIS — E1165 Type 2 diabetes mellitus with hyperglycemia: Secondary | ICD-10-CM | POA: Diagnosis not present

## 2022-10-04 DIAGNOSIS — I10 Essential (primary) hypertension: Secondary | ICD-10-CM | POA: Diagnosis not present

## 2022-10-11 DIAGNOSIS — Z125 Encounter for screening for malignant neoplasm of prostate: Secondary | ICD-10-CM | POA: Diagnosis not present

## 2022-10-11 DIAGNOSIS — E7800 Pure hypercholesterolemia, unspecified: Secondary | ICD-10-CM | POA: Diagnosis not present

## 2022-10-11 DIAGNOSIS — R5383 Other fatigue: Secondary | ICD-10-CM | POA: Diagnosis not present

## 2022-10-11 DIAGNOSIS — Z79899 Other long term (current) drug therapy: Secondary | ICD-10-CM | POA: Diagnosis not present

## 2022-10-13 ENCOUNTER — Telehealth: Payer: Self-pay | Admitting: *Deleted

## 2022-10-13 NOTE — Patient Outreach (Signed)
  Care Coordination   10/13/2022 Name: ILIA ENGELBERT MRN: [id number] DOB: 10-May-1935   Care Coordination Outreach Attempts:  An unsuccessful telephone outreach was attempted today to offer the patient information about available care coordination services as a benefit of their health plan.   Call was initially successful to wife's number, she requested to call member directly.    Follow Up Plan:  Additional outreach attempts will be made to offer the patient care coordination information and services.   Encounter Outcome:  No Answer  Care Coordination Interventions Activated:  No   Care Coordination Interventions:  No, not indicated    Valente David, RN, MSN, [facility] Care Management Care Management Coordinator [phone]

## 2022-10-17 DIAGNOSIS — H3561 Retinal hemorrhage, right eye: Secondary | ICD-10-CM | POA: Diagnosis not present

## 2022-10-18 ENCOUNTER — Telehealth: Payer: Self-pay | Admitting: *Deleted

## 2022-10-18 ENCOUNTER — Encounter: Payer: Self-pay | Admitting: *Deleted

## 2022-10-18 DIAGNOSIS — H469 Unspecified optic neuritis: Secondary | ICD-10-CM | POA: Diagnosis not present

## 2022-10-18 DIAGNOSIS — H34231 Retinal artery branch occlusion, right eye: Secondary | ICD-10-CM | POA: Diagnosis not present

## 2022-10-18 DIAGNOSIS — H401132 Primary open-angle glaucoma, bilateral, moderate stage: Secondary | ICD-10-CM | POA: Diagnosis not present

## 2022-10-18 DIAGNOSIS — H34811 Central retinal vein occlusion, right eye, with macular edema: Secondary | ICD-10-CM | POA: Diagnosis not present

## 2022-10-18 NOTE — Patient Outreach (Signed)
  Care Coordination   Initial Visit Note   10/18/2022 Name: Blake Lawson MRN: [id number] DOB: 1935/05/02  Blake Lawson is a 87 y.o. year old male who sees Monico Blitz, MD for primary care. I spoke with  [name] by phone today.  What matters to the patients health and wellness today?  Had brain surgery about a year or so ago, has had problems with balance since. Denies fall but state he has stumbled frequently.      Goals Addressed             This Visit's Progress    Improvement on balance       Care Coordination Interventions: Evaluation of current treatment plan related to balance issues, stumbling and patient's adherence to plan as established by provider Advised patient to use DME for stability Provided education to patient re: decreasing fall risk Reviewed scheduled/upcoming provider appointments including calling PCP office to schedule visit for 11/7 to discuss balance and possibly outpatient PT orders Advised patient, providing education and rationale, to check cbg twice a day and record, calling provider for findings outside established parameters  Discussed plans with patient for ongoing care management follow up and provided patient with direct contact information for care management team Assessed social determinant of health barriers          SDOH assessments and interventions completed:  Yes  SDOH Interventions Today    Flowsheet Row Most Recent Value  SDOH Interventions   Food Insecurity Interventions Intervention Not Indicated  Housing Interventions Intervention Not Indicated  Transportation Interventions Intervention Not Indicated  Utilities Interventions Intervention Not Indicated        Care Coordination Interventions Activated:  Yes  Care Coordination Interventions:  Yes, provided   Follow up plan: Follow up call scheduled for 11/10    Encounter Outcome:  Pt. Visit Completed   Blake David, RN, MSN, [place] Care Management Care  Management Coordinator [phone]

## 2022-10-18 NOTE — Patient Instructions (Signed)
Visit Information  Thank you for taking time to visit with me today. Please don't hesitate to contact me if I can be of assistance to you before our next scheduled telephone appointment.  Following are the goals we discussed today:  Try to remember insulin daily. Attend PCP appointment on 11/7 @ 2pm Use cane/walker for stability  Our next appointment is by telephone on 11/10  Please call the care guide team at [phone] if you need to cancel or reschedule your appointment.   Please call the Suicide and Crisis Lifeline: 988 call the [place] National Suicide Prevention Lifeline: 564-737-9259 or TTY: (802)394-4254 TTY 718-675-0819) to talk to a trained counselor call 1-800-273-TALK (toll free, 24 hour hotline) if you are experiencing a Mental Health or [place] or need someone to talk to.  Patient verbalizes understanding of instructions and care plan provided today and agrees to view in [place]. Active MyChart status and patient understanding of how to access instructions and care plan via MyChart confirmed with patient.     The patient has been provided with contact information for the care management team and has been advised to call with any health related questions or concerns.   Valente David, RN, MSN, [place] Care Management Care Management Coordinator [phone]

## 2022-10-25 ENCOUNTER — Telehealth: Payer: Self-pay | Admitting: *Deleted

## 2022-10-25 NOTE — Progress Notes (Signed)
  Care Coordination Note  10/25/2022 Name: Blake Lawson MRN: [id number] DOB: 1935-10-17  Blake Lawson is a 87 y.o. year old male who is a primary care patient of Monico Blitz, MD and is actively engaged with the care management team. I reached out to [name] by phone today to assist with re-scheduling a follow up visit with the RN Case Manager  Follow up plan: Unsuccessful telephone outreach attempt made. A HIPAA compliant phone message was left for the patient providing contact information and requesting a return call.   [place]  Direct Dial: [phone]

## 2022-11-01 DIAGNOSIS — I10 Essential (primary) hypertension: Secondary | ICD-10-CM | POA: Diagnosis not present

## 2022-11-01 DIAGNOSIS — Z299 Encounter for prophylactic measures, unspecified: Secondary | ICD-10-CM | POA: Diagnosis not present

## 2022-11-01 DIAGNOSIS — D320 Benign neoplasm of cerebral meninges: Secondary | ICD-10-CM | POA: Diagnosis not present

## 2022-11-01 DIAGNOSIS — Z6824 Body mass index (BMI) 24.0-24.9, adult: Secondary | ICD-10-CM | POA: Diagnosis not present

## 2022-11-01 DIAGNOSIS — R2681 Unsteadiness on feet: Secondary | ICD-10-CM | POA: Diagnosis not present

## 2022-11-01 NOTE — Progress Notes (Signed)
  Care Coordination Note  11/01/2022 Name: Blake Lawson MRN: [id number] DOB: June 19, 1935  Blake Lawson is a 87 y.o. year old male who is a primary care patient of Monico Blitz, MD and is actively engaged with the care management team. I reached out to [name] by phone today to assist with re-scheduling a follow up visit with the RN Case Manager  Follow up plan: Unsuccessful telephone outreach attempt made. A HIPAA compliant phone message was left for the patient providing contact information and requesting a return call.   [place]  Direct Dial: [phone]

## 2022-11-04 ENCOUNTER — Encounter: Payer: Medicare PPO | Admitting: *Deleted

## 2022-11-10 NOTE — Telephone Encounter (Signed)
  Care Coordination Note  11/10/2022 Name: Blake Lawson MRN: [id number] DOB: [birth year]  Blake Lawson is a 87 y.o. year old male who is a primary care patient of Monico Blitz, MD and is actively engaged with the care management team. I reached out to [name] by phone today to assist with re-scheduling a follow up visit with the RN Case Manager  Follow up plan: Telephone appointment with care management team member scheduled for:11/14/22  [place]: [phone]

## 2022-11-14 ENCOUNTER — Ambulatory Visit: Payer: Self-pay | Admitting: *Deleted

## 2022-11-14 ENCOUNTER — Encounter: Payer: Self-pay | Admitting: *Deleted

## 2022-11-14 DIAGNOSIS — I10 Essential (primary) hypertension: Secondary | ICD-10-CM

## 2022-11-15 DIAGNOSIS — R2689 Other abnormalities of gait and mobility: Secondary | ICD-10-CM | POA: Diagnosis not present

## 2022-11-15 DIAGNOSIS — R2681 Unsteadiness on feet: Secondary | ICD-10-CM | POA: Diagnosis not present

## 2022-11-15 DIAGNOSIS — M2560 Stiffness of unspecified joint, not elsewhere classified: Secondary | ICD-10-CM | POA: Diagnosis not present

## 2022-11-15 DIAGNOSIS — R29898 Other symptoms and signs involving the musculoskeletal system: Secondary | ICD-10-CM | POA: Diagnosis not present

## 2022-11-16 ENCOUNTER — Other Ambulatory Visit (HOSPITAL_COMMUNITY): Payer: Self-pay | Admitting: Neurosurgery

## 2022-11-16 DIAGNOSIS — D329 Benign neoplasm of meninges, unspecified: Secondary | ICD-10-CM

## 2022-11-21 DIAGNOSIS — M2560 Stiffness of unspecified joint, not elsewhere classified: Secondary | ICD-10-CM | POA: Diagnosis not present

## 2022-11-21 DIAGNOSIS — R2689 Other abnormalities of gait and mobility: Secondary | ICD-10-CM | POA: Diagnosis not present

## 2022-11-21 DIAGNOSIS — I10 Essential (primary) hypertension: Secondary | ICD-10-CM | POA: Insufficient documentation

## 2022-11-21 DIAGNOSIS — R2681 Unsteadiness on feet: Secondary | ICD-10-CM | POA: Diagnosis not present

## 2022-11-21 DIAGNOSIS — R29898 Other symptoms and signs involving the musculoskeletal system: Secondary | ICD-10-CM | POA: Diagnosis not present

## 2022-11-21 NOTE — Patient Outreach (Signed)
  Care Coordination   Follow Up Visit Note   11/14/2022 Name: Blake Lawson MRN: [id number] DOB: March 17, 1935  Blake Lawson is a 87 y.o. year old male who sees Monico Blitz, MD for primary care. I spoke with  [name] by phone today.  What matters to the patients health and wellness today?  Improving balance    Goals Addressed             This Visit's Progress    Improvement on balance       Care Coordination Interventions: Evaluation of current treatment plan related to balance issues, stumbling and patient's adherence to plan as established by provider Advised patient to use DME for stability Provided education to patient re: decreasing fall risk Discussed plans with patient for ongoing care management follow up and provided patient with direct contact information for care management team Assessed social determinant of health barriers Encouraged to check blood pressure, HR, and O2 daily and PRN and to record. Take record to PCP visits. Provided verbal education on orthostatic hypotension. Assessed for potential orthostatic hypotension based on symptoms.  Assessed need for blood pressure monitor. Unsure if insurance will cover. [id number] placed to see if patient can obtain a monitor through insurance or through [facility]. Encouraged to continue checking and recording blood sugar twice daily and PRN and to call PCP with any readings outside of recommended range. Mailed blood pressure log and information on hypertension management Provided with [facility] telephone number and encouraged to reach out as needed          SDOH assessments and interventions completed:  Yes  SDOH Interventions Today    Flowsheet Row Most Recent Value  SDOH Interventions   Housing Interventions Intervention Not Indicated  Transportation Interventions Intervention Not Indicated  Financial Strain Interventions Intervention Not Indicated        Care Coordination Interventions:  Yes, provided    Follow up plan: Follow up call scheduled for 11/28/22    Encounter Outcome:  Pt. Visit Completed   Chong Sicilian, BSN, RN-BC RN Care Coordinator [place]: [phone] Main #: [phone]

## 2022-11-22 ENCOUNTER — Telehealth: Payer: Self-pay | Admitting: *Deleted

## 2022-11-22 NOTE — Telephone Encounter (Signed)
   Telephone encounter was:  Successful.  11/22/2022 Name: Blake Lawson MRN: [id number] DOB: 1935/11/12  Blake Lawson is a 87 y.o. year old male who is a primary care patient of Monico Blitz, MD . The community resource team was consulted for assistance with  Blood pressure cuff   Care guide performed the following interventions: Patient provided with information about care guide support team and interviewed to confirm resource needs. Patient advised that he had one at home advised him to take to his next dr appt and they could check to see its accurate  Follow Up Plan:  No further follow up planned at this time. The patient has been provided with needed resources. [place] [phone] [address] , [place] [address] Email : [email or web address]

## 2022-11-23 DIAGNOSIS — R2689 Other abnormalities of gait and mobility: Secondary | ICD-10-CM | POA: Diagnosis not present

## 2022-11-23 DIAGNOSIS — R2681 Unsteadiness on feet: Secondary | ICD-10-CM | POA: Diagnosis not present

## 2022-11-23 DIAGNOSIS — R29898 Other symptoms and signs involving the musculoskeletal system: Secondary | ICD-10-CM | POA: Diagnosis not present

## 2022-11-23 DIAGNOSIS — M2560 Stiffness of unspecified joint, not elsewhere classified: Secondary | ICD-10-CM | POA: Diagnosis not present

## 2022-11-28 ENCOUNTER — Encounter: Payer: Self-pay | Admitting: *Deleted

## 2022-11-28 ENCOUNTER — Ambulatory Visit: Payer: Self-pay | Admitting: *Deleted

## 2022-11-28 NOTE — Patient Outreach (Signed)
  Care Coordination   Follow Up Visit Note   11/28/2022 Name: Blake Lawson MRN: [id number] DOB: July 09, 1935  Blake Lawson is a 87 y.o. year old male who sees Monico Blitz, MD for primary care. I spoke with  [name] by phone today.  What matters to the patients health and wellness today?  Working with PT to improve balance and gait    Goals Addressed             This Visit's Progress    Improvement on balance       Care Coordination Interventions: Advised patient of importance of notifying provider of falls Assessed for falls since last encounter Assessed patients knowledge of fall risk prevention secondary to previously provided education Discussed physical therapy through [facility] and reviewed recent notes. Patient feels that it is helping and he is resting better at night as well Reviewed upcoming PT appointments Encouraged to check blood pressure, HR, and O2 daily and PRN and to record. Take record to PCP visits. Encouraged to continue checking and recording blood sugar twice daily and PRN and to call PCP with any readings outside of recommended range. Mailed blood pressure log and information on hypertension management Provided with [facility] telephone number and encouraged to reach out as needed           SDOH assessments and interventions completed:  No     Care Coordination Interventions:  Yes, provided   Follow up plan: Follow up call scheduled for 01/02/23    Encounter Outcome:  Pt. Visit Completed   Chong Sicilian, BSN, RN-BC RN Care Coordinator [place]: [phone] Main #: [phone]

## 2022-11-29 DIAGNOSIS — R2681 Unsteadiness on feet: Secondary | ICD-10-CM | POA: Diagnosis not present

## 2022-11-29 DIAGNOSIS — R2689 Other abnormalities of gait and mobility: Secondary | ICD-10-CM | POA: Diagnosis not present

## 2022-11-29 DIAGNOSIS — M2560 Stiffness of unspecified joint, not elsewhere classified: Secondary | ICD-10-CM | POA: Diagnosis not present

## 2022-11-29 DIAGNOSIS — R29898 Other symptoms and signs involving the musculoskeletal system: Secondary | ICD-10-CM | POA: Diagnosis not present

## 2022-12-01 DIAGNOSIS — M2560 Stiffness of unspecified joint, not elsewhere classified: Secondary | ICD-10-CM | POA: Diagnosis not present

## 2022-12-01 DIAGNOSIS — R2681 Unsteadiness on feet: Secondary | ICD-10-CM | POA: Diagnosis not present

## 2022-12-01 DIAGNOSIS — R29898 Other symptoms and signs involving the musculoskeletal system: Secondary | ICD-10-CM | POA: Diagnosis not present

## 2022-12-01 DIAGNOSIS — R2689 Other abnormalities of gait and mobility: Secondary | ICD-10-CM | POA: Diagnosis not present

## 2022-12-07 DIAGNOSIS — R2681 Unsteadiness on feet: Secondary | ICD-10-CM | POA: Diagnosis not present

## 2022-12-07 DIAGNOSIS — R29898 Other symptoms and signs involving the musculoskeletal system: Secondary | ICD-10-CM | POA: Diagnosis not present

## 2022-12-07 DIAGNOSIS — R2689 Other abnormalities of gait and mobility: Secondary | ICD-10-CM | POA: Diagnosis not present

## 2022-12-07 DIAGNOSIS — M2560 Stiffness of unspecified joint, not elsewhere classified: Secondary | ICD-10-CM | POA: Diagnosis not present

## 2022-12-08 ENCOUNTER — Ambulatory Visit (HOSPITAL_COMMUNITY)
Admission: RE | Admit: 2022-12-08 | Discharge: 2022-12-08 | Disposition: A | Discharge status: Home / Self Care | Payer: Medicare PPO | Source: Ambulatory Visit | Attending: Neurosurgery | Admitting: Neurosurgery

## 2022-12-08 DIAGNOSIS — D329 Benign neoplasm of meninges, unspecified: Secondary | ICD-10-CM | POA: Diagnosis not present

## 2022-12-08 DIAGNOSIS — D496 Neoplasm of unspecified behavior of brain: Secondary | ICD-10-CM | POA: Diagnosis not present

## 2022-12-08 DIAGNOSIS — I639 Cerebral infarction, unspecified: Secondary | ICD-10-CM | POA: Diagnosis not present

## 2022-12-08 DIAGNOSIS — Z9889 Other specified postprocedural states: Secondary | ICD-10-CM | POA: Diagnosis not present

## 2022-12-08 MED ORDER — GADOBUTROL 1 MMOL/ML IV SOLN
8.0000 mL | Freq: Once | INTRAVENOUS | Status: AC | PRN
  Administered 2022-12-08: 8 mL via INTRAVENOUS

## 2022-12-09 DIAGNOSIS — R29898 Other symptoms and signs involving the musculoskeletal system: Secondary | ICD-10-CM | POA: Diagnosis not present

## 2022-12-09 DIAGNOSIS — R2681 Unsteadiness on feet: Secondary | ICD-10-CM | POA: Diagnosis not present

## 2022-12-09 DIAGNOSIS — R2689 Other abnormalities of gait and mobility: Secondary | ICD-10-CM | POA: Diagnosis not present

## 2022-12-09 DIAGNOSIS — M2560 Stiffness of unspecified joint, not elsewhere classified: Secondary | ICD-10-CM | POA: Diagnosis not present

## 2022-12-13 DIAGNOSIS — R2689 Other abnormalities of gait and mobility: Secondary | ICD-10-CM | POA: Diagnosis not present

## 2022-12-13 DIAGNOSIS — R29898 Other symptoms and signs involving the musculoskeletal system: Secondary | ICD-10-CM | POA: Diagnosis not present

## 2022-12-13 DIAGNOSIS — R2681 Unsteadiness on feet: Secondary | ICD-10-CM | POA: Diagnosis not present

## 2022-12-13 DIAGNOSIS — M2560 Stiffness of unspecified joint, not elsewhere classified: Secondary | ICD-10-CM | POA: Diagnosis not present

## 2022-12-15 DIAGNOSIS — R2681 Unsteadiness on feet: Secondary | ICD-10-CM | POA: Diagnosis not present

## 2022-12-15 DIAGNOSIS — R2689 Other abnormalities of gait and mobility: Secondary | ICD-10-CM | POA: Diagnosis not present

## 2022-12-15 DIAGNOSIS — M2560 Stiffness of unspecified joint, not elsewhere classified: Secondary | ICD-10-CM | POA: Diagnosis not present

## 2022-12-15 DIAGNOSIS — R29898 Other symptoms and signs involving the musculoskeletal system: Secondary | ICD-10-CM | POA: Diagnosis not present

## 2022-12-20 DIAGNOSIS — R2681 Unsteadiness on feet: Secondary | ICD-10-CM | POA: Diagnosis not present

## 2022-12-20 DIAGNOSIS — M2560 Stiffness of unspecified joint, not elsewhere classified: Secondary | ICD-10-CM | POA: Diagnosis not present

## 2022-12-20 DIAGNOSIS — R29898 Other symptoms and signs involving the musculoskeletal system: Secondary | ICD-10-CM | POA: Diagnosis not present

## 2022-12-20 DIAGNOSIS — R2689 Other abnormalities of gait and mobility: Secondary | ICD-10-CM | POA: Diagnosis not present

## 2022-12-28 DIAGNOSIS — M2560 Stiffness of unspecified joint, not elsewhere classified: Secondary | ICD-10-CM | POA: Diagnosis not present

## 2022-12-28 DIAGNOSIS — R2681 Unsteadiness on feet: Secondary | ICD-10-CM | POA: Diagnosis not present

## 2022-12-28 DIAGNOSIS — R29898 Other symptoms and signs involving the musculoskeletal system: Secondary | ICD-10-CM | POA: Diagnosis not present

## 2022-12-28 DIAGNOSIS — R2689 Other abnormalities of gait and mobility: Secondary | ICD-10-CM | POA: Diagnosis not present

## 2022-12-30 DIAGNOSIS — R2681 Unsteadiness on feet: Secondary | ICD-10-CM | POA: Diagnosis not present

## 2022-12-30 DIAGNOSIS — M2560 Stiffness of unspecified joint, not elsewhere classified: Secondary | ICD-10-CM | POA: Diagnosis not present

## 2022-12-30 DIAGNOSIS — R2689 Other abnormalities of gait and mobility: Secondary | ICD-10-CM | POA: Diagnosis not present

## 2022-12-30 DIAGNOSIS — R29898 Other symptoms and signs involving the musculoskeletal system: Secondary | ICD-10-CM | POA: Diagnosis not present

## 2023-01-02 ENCOUNTER — Ambulatory Visit: Payer: Self-pay | Admitting: *Deleted

## 2023-01-02 ENCOUNTER — Encounter: Payer: Self-pay | Admitting: *Deleted

## 2023-01-02 NOTE — Patient Outreach (Addendum)
  Care Coordination   Follow Up Visit Note   01/02/2023 Name: Blake Lawson MRN: [id number] DOB: 1935-05-14  Blake Lawson is a 88 y.o. year old male who sees Monico Blitz, MD for primary care. I spoke with  [name] by phone today.  What matters to the patients health and wellness today?  Improve balance and decrease fall risk   Goals Addressed             This Visit's Progress    Improve Balance and Decrease Falls       Care Coordination Interventions: Advised patient of importance of notifying provider of falls Assessed for falls since last encounter Assessed patients knowledge of fall risk prevention secondary to previously provided education Mechanical fall yesterday. Tripped on dog leash tied to porch steps. No injury.  Discussed physical therapy through [facility] and reviewed recent notes. Patient feels that it is helping but he's still a little "shaky" No upcoming PT appts scheduled. Due for reassessment on 01/15/23. Advised patient to reach out to office to discuss when he should come back in. Last visit was 12/30/22.  Encouraged to check blood pressure, HR, and O2 daily and PRN and to record. Take record to PCP visits. BP today was 162/76, HR 51, O2 93% Has not been checking regularly but will start. Encouraged to check and record daily and as needed. Would like for him to check his HR several times a day and record so that there are more readings to compare.  Discussed that beta blocker, metoprolol, will cause a decreased heart rate Patient feels well and doesn't feel lightheaded, dizzy, SOB, more tired than usual, etc Made PCP aware of vital signs Encouraged to continue checking and recording blood sugar twice daily and PRN and to call PCP with any readings outside of recommended range. Discussed upcoming appointments. PCP appt next month per patient.  Provided with [facility] telephone number and encouraged to reach out as needed           SDOH assessments  and interventions completed:  Yes SDOH Interventions Today    Flowsheet Row Most Recent Value  SDOH Interventions   Transportation Interventions Intervention Not Indicated  Physical Activity Interventions Other (Comments)  [Physical therapy for balance and gait training]        Care Coordination Interventions:  Yes, provided   Follow up plan: Follow up call scheduled for 01/30/23    Encounter Outcome:  Pt. Visit Completed   Chong Sicilian, BSN, RN-BC RN Care Coordinator [place]: [phone] Main #: [phone]

## 2023-01-03 DIAGNOSIS — R2689 Other abnormalities of gait and mobility: Secondary | ICD-10-CM | POA: Diagnosis not present

## 2023-01-03 DIAGNOSIS — R2681 Unsteadiness on feet: Secondary | ICD-10-CM | POA: Diagnosis not present

## 2023-01-03 DIAGNOSIS — R29898 Other symptoms and signs involving the musculoskeletal system: Secondary | ICD-10-CM | POA: Diagnosis not present

## 2023-01-03 DIAGNOSIS — M2560 Stiffness of unspecified joint, not elsewhere classified: Secondary | ICD-10-CM | POA: Diagnosis not present

## 2023-01-06 DIAGNOSIS — Z794 Long term (current) use of insulin: Secondary | ICD-10-CM | POA: Diagnosis not present

## 2023-01-06 DIAGNOSIS — I10 Essential (primary) hypertension: Secondary | ICD-10-CM | POA: Diagnosis not present

## 2023-01-06 DIAGNOSIS — Z299 Encounter for prophylactic measures, unspecified: Secondary | ICD-10-CM | POA: Diagnosis not present

## 2023-01-06 DIAGNOSIS — E1165 Type 2 diabetes mellitus with hyperglycemia: Secondary | ICD-10-CM | POA: Diagnosis not present

## 2023-01-10 DIAGNOSIS — R29898 Other symptoms and signs involving the musculoskeletal system: Secondary | ICD-10-CM | POA: Diagnosis not present

## 2023-01-10 DIAGNOSIS — R2689 Other abnormalities of gait and mobility: Secondary | ICD-10-CM | POA: Diagnosis not present

## 2023-01-10 DIAGNOSIS — R2681 Unsteadiness on feet: Secondary | ICD-10-CM | POA: Diagnosis not present

## 2023-01-10 DIAGNOSIS — M2560 Stiffness of unspecified joint, not elsewhere classified: Secondary | ICD-10-CM | POA: Diagnosis not present

## 2023-01-12 DIAGNOSIS — R2689 Other abnormalities of gait and mobility: Secondary | ICD-10-CM | POA: Diagnosis not present

## 2023-01-12 DIAGNOSIS — R2681 Unsteadiness on feet: Secondary | ICD-10-CM | POA: Diagnosis not present

## 2023-01-12 DIAGNOSIS — R29898 Other symptoms and signs involving the musculoskeletal system: Secondary | ICD-10-CM | POA: Diagnosis not present

## 2023-01-12 DIAGNOSIS — M2560 Stiffness of unspecified joint, not elsewhere classified: Secondary | ICD-10-CM | POA: Diagnosis not present

## 2023-01-18 DIAGNOSIS — R2689 Other abnormalities of gait and mobility: Secondary | ICD-10-CM | POA: Diagnosis not present

## 2023-01-18 DIAGNOSIS — M2560 Stiffness of unspecified joint, not elsewhere classified: Secondary | ICD-10-CM | POA: Diagnosis not present

## 2023-01-18 DIAGNOSIS — R2681 Unsteadiness on feet: Secondary | ICD-10-CM | POA: Diagnosis not present

## 2023-01-18 DIAGNOSIS — R29898 Other symptoms and signs involving the musculoskeletal system: Secondary | ICD-10-CM | POA: Diagnosis not present

## 2023-01-23 DIAGNOSIS — R2689 Other abnormalities of gait and mobility: Secondary | ICD-10-CM | POA: Diagnosis not present

## 2023-01-23 DIAGNOSIS — R2681 Unsteadiness on feet: Secondary | ICD-10-CM | POA: Diagnosis not present

## 2023-01-23 DIAGNOSIS — R29898 Other symptoms and signs involving the musculoskeletal system: Secondary | ICD-10-CM | POA: Diagnosis not present

## 2023-01-23 DIAGNOSIS — M2560 Stiffness of unspecified joint, not elsewhere classified: Secondary | ICD-10-CM | POA: Diagnosis not present

## 2023-01-25 DIAGNOSIS — R29898 Other symptoms and signs involving the musculoskeletal system: Secondary | ICD-10-CM | POA: Diagnosis not present

## 2023-01-25 DIAGNOSIS — M2560 Stiffness of unspecified joint, not elsewhere classified: Secondary | ICD-10-CM | POA: Diagnosis not present

## 2023-01-25 DIAGNOSIS — R2681 Unsteadiness on feet: Secondary | ICD-10-CM | POA: Diagnosis not present

## 2023-01-25 DIAGNOSIS — R2689 Other abnormalities of gait and mobility: Secondary | ICD-10-CM | POA: Diagnosis not present

## 2023-01-30 ENCOUNTER — Ambulatory Visit: Payer: Self-pay | Admitting: *Deleted

## 2023-01-30 DIAGNOSIS — R29898 Other symptoms and signs involving the musculoskeletal system: Secondary | ICD-10-CM | POA: Diagnosis not present

## 2023-01-30 DIAGNOSIS — R2681 Unsteadiness on feet: Secondary | ICD-10-CM | POA: Diagnosis not present

## 2023-01-30 DIAGNOSIS — R2689 Other abnormalities of gait and mobility: Secondary | ICD-10-CM | POA: Diagnosis not present

## 2023-01-30 DIAGNOSIS — M2560 Stiffness of unspecified joint, not elsewhere classified: Secondary | ICD-10-CM | POA: Diagnosis not present

## 2023-01-30 NOTE — Patient Outreach (Signed)
  Care Coordination   01/30/2023 Name: Blake Lawson MRN: [id number] DOB: 03/26/1935   Care Coordination Outreach Attempts:  An unsuccessful telephone outreach was attempted for a scheduled appointment today.  Follow Up Plan:  Additional outreach attempts will be made to offer the patient care coordination information and services.   Encounter Outcome:  No Answer   Care Coordination Interventions:  No, not indicated    Chong Sicilian, BSN, RN-BC RN Care Coordinator [place]: [phone] Main #: [phone]

## 2023-02-01 DIAGNOSIS — R2681 Unsteadiness on feet: Secondary | ICD-10-CM | POA: Diagnosis not present

## 2023-02-01 DIAGNOSIS — R29898 Other symptoms and signs involving the musculoskeletal system: Secondary | ICD-10-CM | POA: Diagnosis not present

## 2023-02-01 DIAGNOSIS — R2689 Other abnormalities of gait and mobility: Secondary | ICD-10-CM | POA: Diagnosis not present

## 2023-02-01 DIAGNOSIS — M2560 Stiffness of unspecified joint, not elsewhere classified: Secondary | ICD-10-CM | POA: Diagnosis not present

## 2023-02-03 DIAGNOSIS — Z1331 Encounter for screening for depression: Secondary | ICD-10-CM | POA: Diagnosis not present

## 2023-02-03 DIAGNOSIS — I10 Essential (primary) hypertension: Secondary | ICD-10-CM | POA: Diagnosis not present

## 2023-02-03 DIAGNOSIS — D320 Benign neoplasm of cerebral meninges: Secondary | ICD-10-CM | POA: Diagnosis not present

## 2023-02-03 DIAGNOSIS — Z0000 Encounter for general adult medical examination without abnormal findings: Secondary | ICD-10-CM | POA: Diagnosis not present

## 2023-02-03 DIAGNOSIS — Z87891 Personal history of nicotine dependence: Secondary | ICD-10-CM | POA: Diagnosis not present

## 2023-02-03 DIAGNOSIS — Z7189 Other specified counseling: Secondary | ICD-10-CM | POA: Diagnosis not present

## 2023-02-03 DIAGNOSIS — Z1339 Encounter for screening examination for other mental health and behavioral disorders: Secondary | ICD-10-CM | POA: Diagnosis not present

## 2023-02-03 DIAGNOSIS — Z6826 Body mass index (BMI) 26.0-26.9, adult: Secondary | ICD-10-CM | POA: Diagnosis not present

## 2023-02-03 DIAGNOSIS — Z299 Encounter for prophylactic measures, unspecified: Secondary | ICD-10-CM | POA: Diagnosis not present

## 2023-02-06 ENCOUNTER — Telehealth: Payer: Self-pay | Admitting: *Deleted

## 2023-02-06 NOTE — Progress Notes (Signed)
  Care Coordination Note  02/06/2023 Name: [name] MRN: [id number] DOB: 01-Sep-1935  Blake Lawson is a 88 y.o. year old male who is a primary care patient of Monico Blitz, MD and is actively engaged with the care management team. I reached out to [name] by phone today to assist with re-scheduling a follow up visit with the RN Case Manager  Follow up plan: Telephone appointment with care management team member scheduled for:02/09/23  [place]  Direct Dial: [phone]

## 2023-02-06 NOTE — Progress Notes (Signed)
  Care Coordination Note  02/06/2023 Name: [name] MRN: [id number] DOB: 1935-03-18  Blake Lawson is a 88 y.o. year old male who is a primary care patient of Monico Blitz, MD and is actively engaged with the care management team. I reached out to [name] by phone today to assist with re-scheduling a follow up visit with the RN Case Manager  Follow up plan: Unsuccessful telephone outreach attempt made. A HIPAA compliant phone message was left for the patient providing contact information and requesting a return call.   [place]  Direct Dial: [phone]

## 2023-02-09 ENCOUNTER — Encounter: Payer: Self-pay | Admitting: *Deleted

## 2023-02-13 DIAGNOSIS — J069 Acute upper respiratory infection, unspecified: Secondary | ICD-10-CM | POA: Diagnosis not present

## 2023-02-13 DIAGNOSIS — R059 Cough, unspecified: Secondary | ICD-10-CM | POA: Diagnosis not present

## 2023-02-13 DIAGNOSIS — E1129 Type 2 diabetes mellitus with other diabetic kidney complication: Secondary | ICD-10-CM | POA: Diagnosis not present

## 2023-02-13 DIAGNOSIS — D6869 Other thrombophilia: Secondary | ICD-10-CM | POA: Diagnosis not present

## 2023-02-13 DIAGNOSIS — Z299 Encounter for prophylactic measures, unspecified: Secondary | ICD-10-CM | POA: Diagnosis not present

## 2023-02-15 DIAGNOSIS — M2560 Stiffness of unspecified joint, not elsewhere classified: Secondary | ICD-10-CM | POA: Diagnosis not present

## 2023-02-15 DIAGNOSIS — R29898 Other symptoms and signs involving the musculoskeletal system: Secondary | ICD-10-CM | POA: Diagnosis not present

## 2023-02-15 DIAGNOSIS — R2689 Other abnormalities of gait and mobility: Secondary | ICD-10-CM | POA: Diagnosis not present

## 2023-02-15 DIAGNOSIS — R2681 Unsteadiness on feet: Secondary | ICD-10-CM | POA: Diagnosis not present

## 2023-02-17 DIAGNOSIS — H34231 Retinal artery branch occlusion, right eye: Secondary | ICD-10-CM | POA: Diagnosis not present

## 2023-02-17 DIAGNOSIS — H34811 Central retinal vein occlusion, right eye, with macular edema: Secondary | ICD-10-CM | POA: Diagnosis not present

## 2023-02-17 DIAGNOSIS — H469 Unspecified optic neuritis: Secondary | ICD-10-CM | POA: Diagnosis not present

## 2023-02-17 DIAGNOSIS — E119 Type 2 diabetes mellitus without complications: Secondary | ICD-10-CM | POA: Diagnosis not present

## 2023-02-21 ENCOUNTER — Ambulatory Visit: Payer: Self-pay | Admitting: *Deleted

## 2023-02-21 NOTE — Patient Outreach (Signed)
  Care Coordination   02/21/2023 Name: Blake Lawson MRN: [id number] DOB: 1935/04/13   Care Coordination Outreach Attempts:  An unsuccessful telephone outreach was attempted for a scheduled appointment today.  Follow Up Plan:  Additional outreach attempts will be made to offer the patient care coordination information and services.   Encounter Outcome:  No Answer   Care Coordination Interventions:  No, not indicated    Chong Sicilian, BSN, RN-BC RN Care Coordinator [place]: [phone] Main #: [phone]

## 2023-02-23 DIAGNOSIS — M2560 Stiffness of unspecified joint, not elsewhere classified: Secondary | ICD-10-CM | POA: Diagnosis not present

## 2023-02-23 DIAGNOSIS — R2681 Unsteadiness on feet: Secondary | ICD-10-CM | POA: Diagnosis not present

## 2023-02-23 DIAGNOSIS — R29898 Other symptoms and signs involving the musculoskeletal system: Secondary | ICD-10-CM | POA: Diagnosis not present

## 2023-02-23 DIAGNOSIS — R2689 Other abnormalities of gait and mobility: Secondary | ICD-10-CM | POA: Diagnosis not present

## 2023-02-27 DIAGNOSIS — D329 Benign neoplasm of meninges, unspecified: Secondary | ICD-10-CM | POA: Diagnosis not present

## 2023-02-27 DIAGNOSIS — R2689 Other abnormalities of gait and mobility: Secondary | ICD-10-CM | POA: Diagnosis not present

## 2023-02-27 DIAGNOSIS — R2681 Unsteadiness on feet: Secondary | ICD-10-CM | POA: Diagnosis not present

## 2023-02-27 DIAGNOSIS — M2560 Stiffness of unspecified joint, not elsewhere classified: Secondary | ICD-10-CM | POA: Diagnosis not present

## 2023-02-27 DIAGNOSIS — R29898 Other symptoms and signs involving the musculoskeletal system: Secondary | ICD-10-CM | POA: Diagnosis not present

## 2023-02-28 ENCOUNTER — Telehealth: Payer: Self-pay | Admitting: *Deleted

## 2023-02-28 ENCOUNTER — Other Ambulatory Visit: Payer: Self-pay | Admitting: Radiation Therapy

## 2023-02-28 NOTE — Progress Notes (Unsigned)
  Care Coordination Note  02/28/2023 Name: Blake Lawson MRN: [id number] DOB: 07/15/1935  Blake Lawson is a 88 y.o. year old male who is a primary care patient of Monico Blitz, MD and is actively engaged with the care management team. I reached out to [name] by phone today to assist with re-scheduling a follow up visit with the RN Case Manager  Follow up plan: Unsuccessful telephone outreach attempt made. A HIPAA compliant phone message was left for the patient providing contact information and requesting a return call.   [place]  Direct Dial: [phone]

## 2023-03-01 DIAGNOSIS — R29898 Other symptoms and signs involving the musculoskeletal system: Secondary | ICD-10-CM | POA: Diagnosis not present

## 2023-03-01 DIAGNOSIS — M2560 Stiffness of unspecified joint, not elsewhere classified: Secondary | ICD-10-CM | POA: Diagnosis not present

## 2023-03-01 DIAGNOSIS — R2689 Other abnormalities of gait and mobility: Secondary | ICD-10-CM | POA: Diagnosis not present

## 2023-03-01 DIAGNOSIS — R2681 Unsteadiness on feet: Secondary | ICD-10-CM | POA: Diagnosis not present

## 2023-03-01 NOTE — Progress Notes (Signed)
  Care Coordination Note  03/01/2023 Name: SHAQUIELLE GRANGER MRN: [id number] DOB: 07/24/1935  JAHKAI CLARKIN is a 88 y.o. year old male who is a primary care patient of Monico Blitz, MD and is actively engaged with the care management team. I reached out to [name] by phone today to assist with re-scheduling a follow up visit with the RN Case Manager  Follow up plan: Telephone appointment with care management team member scheduled for:03/08/23  [place]: [phone]

## 2023-03-06 ENCOUNTER — Inpatient Hospital Stay: Payer: Medicare PPO | Attending: Neurosurgery

## 2023-03-06 ENCOUNTER — Telehealth: Payer: Self-pay | Admitting: Radiation Oncology

## 2023-03-06 ENCOUNTER — Other Ambulatory Visit: Payer: Self-pay | Admitting: Radiation Therapy

## 2023-03-06 DIAGNOSIS — D320 Benign neoplasm of cerebral meninges: Secondary | ICD-10-CM

## 2023-03-06 NOTE — Telephone Encounter (Signed)
LVM to mobile number, unable to LVM on hone # to schedule CON with Dr. Isidore Moos

## 2023-03-07 ENCOUNTER — Telehealth: Payer: Self-pay | Admitting: Radiation Oncology

## 2023-03-07 DIAGNOSIS — R2681 Unsteadiness on feet: Secondary | ICD-10-CM | POA: Diagnosis not present

## 2023-03-07 DIAGNOSIS — R29898 Other symptoms and signs involving the musculoskeletal system: Secondary | ICD-10-CM | POA: Diagnosis not present

## 2023-03-07 DIAGNOSIS — M2560 Stiffness of unspecified joint, not elsewhere classified: Secondary | ICD-10-CM | POA: Diagnosis not present

## 2023-03-07 DIAGNOSIS — R2689 Other abnormalities of gait and mobility: Secondary | ICD-10-CM | POA: Diagnosis not present

## 2023-03-07 NOTE — Progress Notes (Signed)
Radiation Oncology         [phone] ________________________________  Initial Outpatient Consultation  Name: Blake Lawson MRN: [id number]  Date: 03/08/2023  DOB: [birth year]  [id number], Blake Picking, MD  Blake Lose, MD   REFERRING PHYSICIAN: Consuella Lose, MD  DIAGNOSIS: D42.0   ICD-10-CM   1. Meningioma, recurrent of brain ([name])  D32.0     2. Atypical meningioma of brain ([facility])  D42.0      Recurrent meningioma - Grade 2    CHIEF COMPLAINT: Here to discuss management of recurrent meningioma   HISTORY OF PRESENT ILLNESS::Blake Lawson is a 88 y.o. male who presents today for consideration of stereotactic radiosurgery as part of management for his recent recurrence of meningioma.  The patient initially presented to the [facility] ED on 06/15/2021 after developing complete vision loss in his right eye 1 month prior. He was seen by his eye doctor several days prior who instructed him to present to the ED for MRI and stroke work-up. In the ED, the patient detailed his vision loss as progressing from hazy vision to total vision loss. He denied as associated symptoms other than clumsiness secondary to loss of vision in one eye. Since the patient denied any other symptoms, he was discharged and arranged for OP work-up the following day.    OP MRI of the brain performed on 06/23/21 showed abnormal right suprasellar soft tissue contiguous with the pituitary suspicious for a mass. Suspected involvement of the orbital apex and/or cisternal right optic nerve was noted given the patient's right vision loss.   MRI of the orbits for further evaluation on 08/03/21 showed a sellar/planum sphenoidale meningioma measuring 1.8 x 2.7 x 1.5 cm, with likely involvement of the orbital apex and possible partial involvement of the right cavernous sinus. No parenchymal edema was appreciated. MRI also showed possible compression of the right cisternal optic nerve, an asymmetric enhancement of the right  optic nerve sheath suggesting involvement, and an additional smaller meningioma along the left greater sphenoid wing measuring 1.3 x 1.2 x 0.5 cm.   Subsequently, the patient was referred to neurosurgery and opted to proceed with craniotomy for resection of the primary meningioma on 09/10/21 under the care of Blake Lawson. Pathology from the procedure revealed WHO grade 2 meningioma.   Post-op MRI of the brain on 09/11/21 showed a possible small residual mass inferior to the right internal carotid artery distal cavernous segment. MRI also showed a post-op collection overlying the right frontal lobe with subsequent mild mass effect, a punctate focus of acute ischemia in the left midbrain, and no change of the left sphenoid wing meningioma.   The patient was lost to follow-up with Blake Lawson for the remainder of 2022 and the majority of 2023. He however presented for a follow-up MRI of the brain on 01/06/22 which showed 2 possible small foci of residual tumor, and a small foci of residual tumor superolateral to the right paraclinoid ICA and between the proximal and distal cavernous. MRI also showed stability of the smaller meningioma along the left greater sphenoid wing.    Of note: Around the end of 2023, the patient was being seen by PT for difficulties with ambulation.  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 Possible extension into the orbital apex along the optic nerve sheath was also noted, as well as a slight interval increase in size of the left  greater sphenoid wing meningioma. MRI otherwise showed a decrease in the extra axial fluid collection with dural thickening and enhancement overlying the right frontal lobe.   The patient was finally able to follow up with Blake Lawson on 02/27/23. During which time, the patient denied any changes but reported having no improvement in his right eye vision. Regarding treatment of his recurrent meningioma, Blake Lawson has recommended consideration of radiation which we will discuss in detail today.  At tumor board on March 11 at which I was present,, the consensus was for standard fractionation radiation therapy to the tumor with adequate margin over approximately 6 weeks given the fact that this is a grade 2 meningioma   PREVIOUS RADIATION THERAPY: No  PAST MEDICAL HISTORY:  has a past medical history of Arthritis, Asthma, Diabetes mellitus, type II ([place]), DVT (deep venous thrombosis) ([place]), Hyperlipidemia, Hypertension, and Stroke ([place]) (05/2021).    PAST SURGICAL HISTORY: Past Surgical History:  Procedure Laterality Date   APPLICATION OF CRANIAL NAVIGATION Right 09/10/2021   Procedure: APPLICATION OF CRANIAL NAVIGATION;  Surgeon: Blake Lose, MD;  Location: [place];  Service: Neurosurgery;  Laterality: Right;   CHOLECYSTECTOMY     CRANIOTOMY Right 09/10/2021   Procedure: 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;  Surgeon: Blake Lose, MD;  Location: [place];  Service: Neurosurgery;  Laterality: Right;   TRANSURETHRAL RESECTION OF PROSTATE      FAMILY HISTORY: family history includes Diabetes in his father and mother; Hypertension in his mother.  SOCIAL HISTORY:  reports that he has quit smoking. His smoking use included cigarettes. He has never used smokeless tobacco. He reports current alcohol use. He reports that he does not use drugs.  ALLERGIES: Patient has no known allergies.  MEDICATIONS:  Current Outpatient Medications  Medication Sig Dispense  Refill   levocetirizine (XYZAL) 5 MG tablet 5 mg as needed.     metoprolol succinate (TOPROL-XL) 50 MG 24 hr tablet Take 50 mg by mouth daily.     albuterol (VENTOLIN HFA) 108 (90 Base) MCG/ACT inhaler Inhale 2 puffs into the lungs every 6 (six) hours as needed for wheezing or shortness of breath.     amLODipine (NORVASC) 5 MG tablet Take 5 mg by mouth daily.     apixaban (ELIQUIS) 5 MG TABS tablet Take 1 tablet (5 mg total) by mouth 2 (two) times daily. 60 tablet    atorvastatin (LIPITOR) 40 MG tablet Take 40 mg by mouth daily.     clotrimazole (LOTRIMIN) 1 % cream Apply 1 application topically daily.     empagliflozin (JARDIANCE) 25 MG TABS tablet Take 25 mg by mouth daily.     insulin NPH-regular Human (HUMULIN 70/30) (70-30) 100 UNIT/ML injection Inject 25-35 Units into the skin 2 (two) times daily with a meal. Per sliding Scale     latanoprost (XALATAN) 0.005 % ophthalmic solution Place 1 drop into both eyes at bedtime.     losartan (COZAAR) 100 MG tablet Take 100 mg by mouth daily.     metFORMIN (GLUCOPHAGE) 500 MG tablet Take 1,000 mg by mouth 2 (two) times daily with a meal.  No current facility-administered medications for this encounter.    REVIEW OF SYSTEMS:  Notable for that above.   PHYSICAL EXAM:  height is '6\' 2"'$  (1.88 m) and weight is 185 lb 9.6 oz (84.2 kg). His temperature is 97.5 F (36.4 C) (abnormal). His blood pressure is 147/67 (abnormal) and his pulse is 50 (abnormal). His respiration is 20 and oxygen saturation is 98%.   General: Alert and oriented, in no acute distress - his significant other provides most of the history. HEENT: Head is normocephalic.  He is blind in his right eye.  No mucosal irritation or thrush in mouth. Heart: Regular in rate and rhythm with no murmurs, rubs, or gallops. Chest: Clear to auscultation bilaterally, with no rhonchi, wheezes, or rales. Abdomen: Soft, nontender, nondistended, with no rigidity or guarding.  Normal active bowel  sounds Extremities: Lower extremity edema. Skin: No concerning lesions. Musculoskeletal: symmetric strength and muscle tone throughout.  He takes short shuffling steps and holds onto the wall at times when walking down the hallway Neurologic:  Speech is fluent. Coordination is intact.  See musculoskeletal exam.  He is also blind in his right eye Psychiatric: Judgment and insight are intact. Affect is appropriate.  KPS = 70  100 - Normal; no complaints; no evidence of disease. 90   - Able to carry on normal activity; minor signs or symptoms of disease. 80   - Normal activity with effort; some signs or symptoms of disease. 28   - Cares for self; unable to carry on normal activity or to do active work. 60   - Requires occasional assistance, but is able to care for most of his personal needs. 50   - Requires considerable assistance and frequent medical care. 60   - Disabled; requires special care and assistance. 3   - Severely disabled; hospital admission is indicated although death not imminent. 28   - Very sick; hospital admission necessary; active supportive treatment necessary. 10   - Moribund; fatal processes progressing rapidly. 0     - Dead  Karnofsky DA, Abelmann [place], Craver LS and Bur[facility] [phone]) The use of the nitrogen mustards in the palliative treatment of carcinoma: with particular reference to bronchogenic carcinoma Cancer 1 634-56      LABORATORY DATA:  Lab Results  Component Value Date   WBC 6.3 09/08/2021   HGB 14.0 09/08/2021   HCT 45.9 09/08/2021   MCV 75.4 (L) 09/08/2021   PLT 195 09/08/2021   CMP     Component Value Date/Time   NA 141 09/08/2021 1036   K 4.0 09/08/2021 1036   CL 107 09/08/2021 1036   CO2 27 09/08/2021 1036   GLUCOSE 134 (H) 09/08/2021 1036   BUN 18 09/08/2021 1036   CREATININE 0.96 09/08/2021 1036   CALCIUM 9.4 09/08/2021 1036   GFRNONAA >60 09/08/2021 1036       RADIOGRAPHY: As above.  I personally reviewed his images     IMPRESSION/PLAN: Recurrent grade 2 meningioma  Today, I talked to the patient about the findings and work-up thus far.  We discussed the patient's diagnosis of recurrent grade 2 meningioma and general treatment for this, highlighting the role of radiotherapy in the management.  We discussed the available radiation techniques, and focused on the details of logistics and delivery.   Further surgery is not recommended by Blake Lawson  We discussed the risks, benefits, and side effects of radiotherapy.  I recommend standard fractionated radiation therapy to a dose of 55.8 Pearline Cables  in 31 fractions to the tumor with adequate margin given that grade 2 meningiomas can be more invasive / infiltrative than grade 1 meningiomas. Side effects may include but not necessarily be limited to: Fatigue, injury to brain, visual loss, pituitary dysfunction, temporary hair loss; no guarantees of treatment were given. The patient was encouraged to ask questions that I answered to the best of my ability.    He lives in [place] and it would be overwhelming for the patient and his wife to drive here daily for 6 weeks.  Therefore, I will make a referral to radiation oncology in [facility]).  They are pleased with this plan.  I wished him the very best.  He understands that repeat brain MRI could be helpful to restage/remeasure the tumor before treatment planning and he will discuss this further with the radiation oncologist in [place].   On date of service, in total, I spent 60 minutes on this encounter. Patient was seen in person.   __________________________________________   Eppie Gibson, MD  This document serves as a record of services personally performed by Eppie Gibson, MD. It was created on her behalf by Roney Mans, a trained medical scribe. The creation of this record is based on the scribe's personal observations and the provider's statements to them. This document has been  checked and approved by the attending provider.

## 2023-03-07 NOTE — Progress Notes (Signed)
Location/Histology of Brain Tumor:  Meningioma, WHO grade 2  09/10/2022 A. BRAIN TUMOR, RIGHT FRONTAL, RESECTION:  - Meningioma, WHO grade II.  - See comment.  COMMENT:  The meningioma shows areas of increased cellularity with sheetlike growth and cells with prominent nucleoli.  There is no significant increase in mitotic activity and no areas of necrosis are identified and the features are most consistent with WHO grade II. Immunohistochemistry for epithelial membrane antigen (EMA) supports the diagnosis.   Patient presented with symptoms of:  (from Dr. Cleotilde Neer 02/27/23 office note):   MRI Brain w/ & w/o Contrast 12/08/2022 --IMPRESSION: Increased size of the residual right paraclinoid meningioma with partial encasement of the paraclinoid ICA and sellar extent with increased leftward deviation of the pituitary stalk. There is also possible extent into the orbital apex along the optic nerve sheath. For follow-up studies, consider orbital protocol for better evaluation of this area. Slight interval increase in size of the the left greater sphenoid wing meningioma. Decreased but not resolved extra-axial fluid collection with dural thickening and enhancement overlying the right frontal lobe.  Past or anticipated interventions, if any, per neurosurgery:  02/27/2023 --Dr. Consuella Lose (office visit)   09/10/2021 --Dr. Consuella Lose  Stereotactic right pterional craniotomy for resection of tumor Use of intraoperative microscope for microdissection  Past or anticipated interventions, if any, per medical oncology:  No referral at this time  Dose of Decadron, if applicable: [id number]  Recent neurologic symptoms, if any:  Seizures: {:18581} Headaches: {:18581} Nausea: {:18581} Dizziness/ataxia: {:18581} Difficulty with hand coordination: {:18581} Focal numbness/weakness: {:18581} Visual deficits/changes: {:18581} Confusion/Memory deficits: {:18581}  SAFETY ISSUES: Prior  radiation? *** Pacemaker/ICD? *** Possible current pregnancy? N/A Is the patient on methotrexate? ***  Additional Complaints / other details: ***

## 2023-03-07 NOTE — Telephone Encounter (Signed)
Called to schedule CON with Dr. Isidore Moos, spoke to pt's wife and confirmed appt for 3/13.

## 2023-03-08 ENCOUNTER — Other Ambulatory Visit: Payer: Self-pay

## 2023-03-08 ENCOUNTER — Ambulatory Visit: Payer: Self-pay | Admitting: *Deleted

## 2023-03-08 ENCOUNTER — Encounter: Payer: Self-pay | Admitting: Radiation Therapy

## 2023-03-08 ENCOUNTER — Ambulatory Visit
Admission: RE | Admit: 2023-03-08 | Discharge: 2023-03-08 | Disposition: A | Discharge status: Home / Self Care | Payer: Medicare PPO | Source: Ambulatory Visit | Attending: Radiation Oncology | Admitting: Radiation Oncology

## 2023-03-08 ENCOUNTER — Other Ambulatory Visit: Payer: Self-pay | Admitting: Radiation Therapy

## 2023-03-08 ENCOUNTER — Encounter: Payer: Self-pay | Admitting: Radiation Oncology

## 2023-03-08 VITALS — BP 147/67 | HR 50 | Temp 97.5°F | Resp 20 | Ht 74.0 in | Wt 185.6 lb

## 2023-03-08 DIAGNOSIS — E1142 Type 2 diabetes mellitus with diabetic polyneuropathy: Secondary | ICD-10-CM | POA: Diagnosis not present

## 2023-03-08 DIAGNOSIS — E785 Hyperlipidemia, unspecified: Secondary | ICD-10-CM | POA: Diagnosis not present

## 2023-03-08 DIAGNOSIS — D320 Benign neoplasm of cerebral meninges: Secondary | ICD-10-CM | POA: Diagnosis not present

## 2023-03-08 DIAGNOSIS — Z794 Long term (current) use of insulin: Secondary | ICD-10-CM | POA: Diagnosis not present

## 2023-03-08 DIAGNOSIS — Z79899 Other long term (current) drug therapy: Secondary | ICD-10-CM | POA: Diagnosis not present

## 2023-03-08 DIAGNOSIS — D329 Benign neoplasm of meninges, unspecified: Secondary | ICD-10-CM

## 2023-03-08 DIAGNOSIS — D420 Neoplasm of uncertain behavior of cerebral meninges: Secondary | ICD-10-CM | POA: Diagnosis not present

## 2023-03-08 DIAGNOSIS — Z7901 Long term (current) use of anticoagulants: Secondary | ICD-10-CM | POA: Diagnosis not present

## 2023-03-08 DIAGNOSIS — Z7984 Long term (current) use of oral hypoglycemic drugs: Secondary | ICD-10-CM | POA: Insufficient documentation

## 2023-03-08 DIAGNOSIS — Z87891 Personal history of nicotine dependence: Secondary | ICD-10-CM | POA: Diagnosis not present

## 2023-03-08 DIAGNOSIS — Z86718 Personal history of other venous thrombosis and embolism: Secondary | ICD-10-CM | POA: Diagnosis not present

## 2023-03-08 DIAGNOSIS — I10 Essential (primary) hypertension: Secondary | ICD-10-CM | POA: Insufficient documentation

## 2023-03-08 DIAGNOSIS — Z8673 Personal history of transient ischemic attack (TIA), and cerebral infarction without residual deficits: Secondary | ICD-10-CM | POA: Insufficient documentation

## 2023-03-08 NOTE — Patient Outreach (Signed)
  Care Coordination   03/08/2023 Name: Blake Lawson MRN: [id number] DOB: 1935/11/11   Care Coordination Outreach Attempts:  An unsuccessful telephone outreach was attempted for a scheduled appointment today.  Follow Up Plan:  No further outreach attempts will be made at this time. We have been unable to contact the patient to offer or enroll patient in care coordination services  Encounter Outcome:  No Answer   Care Coordination Interventions:  No, not indicated    Chong Sicilian, BSN, RN-BC RN Care Coordinator [place]: [phone] Main #: [phone]

## 2023-03-08 NOTE — Progress Notes (Signed)
Dr. Isidore Moos met with Blake Lawson to discuss radiation treatment options for his recurrent meningioma. Mr. Apley is interested in treatment but would like something closer to home. Per Dr. Pearlie Oyster request a referral has been sent to [facility]. I have faxed over Dr. Pearlie Oyster consult note and a copy of the referral to [phone].    Mont Dutton R.T(R)(T) Radiation Special Procedures Navigator

## 2023-03-09 DIAGNOSIS — D320 Benign neoplasm of cerebral meninges: Secondary | ICD-10-CM | POA: Diagnosis not present

## 2023-03-10 DIAGNOSIS — R2689 Other abnormalities of gait and mobility: Secondary | ICD-10-CM | POA: Diagnosis not present

## 2023-03-10 DIAGNOSIS — Z6824 Body mass index (BMI) 24.0-24.9, adult: Secondary | ICD-10-CM | POA: Diagnosis not present

## 2023-03-10 DIAGNOSIS — M2560 Stiffness of unspecified joint, not elsewhere classified: Secondary | ICD-10-CM | POA: Diagnosis not present

## 2023-03-10 DIAGNOSIS — I10 Essential (primary) hypertension: Secondary | ICD-10-CM | POA: Diagnosis not present

## 2023-03-10 DIAGNOSIS — Z299 Encounter for prophylactic measures, unspecified: Secondary | ICD-10-CM | POA: Diagnosis not present

## 2023-03-10 DIAGNOSIS — I739 Peripheral vascular disease, unspecified: Secondary | ICD-10-CM | POA: Diagnosis not present

## 2023-03-10 DIAGNOSIS — R29898 Other symptoms and signs involving the musculoskeletal system: Secondary | ICD-10-CM | POA: Diagnosis not present

## 2023-03-10 DIAGNOSIS — R2681 Unsteadiness on feet: Secondary | ICD-10-CM | POA: Diagnosis not present

## 2023-03-10 DIAGNOSIS — R002 Palpitations: Secondary | ICD-10-CM | POA: Diagnosis not present

## 2023-03-13 ENCOUNTER — Ambulatory Visit: Payer: Self-pay | Admitting: *Deleted

## 2023-03-13 ENCOUNTER — Encounter: Payer: Self-pay | Admitting: *Deleted

## 2023-03-13 NOTE — Patient Outreach (Signed)
Care Coordination   Follow Up Visit Note   03/13/2023 Name: Blake Lawson MRN: [id number] DOB: August 07, 1935  Blake Lawson is a 88 y.o. year old male who sees Blake Blitz, MD for primary care. I spoke with  Blake Lawson by phone today.  What matters to the patients health and wellness today?  Improve balance and strength    Goals Addressed             This Visit's Progress    Improve Balance and Decrease Falls       Care Coordination Goals: Patient will continue to work with physical therapy to improve strength and balance Patient will use cane for balance and practice walking with that Patient will move carefully and change positions slowly to decrease risk for falls Patient will monitor and record blood pressure and pulse and notify provide of any readings outside of recommended range Patient will report any falls to provider and will seek medical attention if necessary Patient will report any new or worsening symptoms to provider or seek appropriate medical attention Patient will continue to work with radiation oncology re: meningioma of the brain Patient will reach out to [place] Coordinator [phone] with any resource or care coordination needs     Manage Blood Pressure & Heart Rate       Care Coordination Goals: Patient will take medications as directed and report any negative side effects to provider  Patient will use a pill box/organizer to help keep up with when to take medications Patient will monitor and record blood pressure and heart rate daily and as needed and will call PCP or specialist with any readings outside of recommended range Patient will keep all recommended follow-up appointments with PCP and specialists (cardiology, nephrology, etc) Patient will take blood pressure log to PCP and specialty appointments for review Patient will follow a low sodium/DASH diet  Patient will reach out to [place] Coordinator [phone] with any care coordination or  resource needs          SDOH assessments and interventions completed:  Yes  SDOH Interventions Today    Flowsheet Row Most Recent Value  SDOH Interventions   Housing Interventions Intervention Not Indicated  Utilities Interventions Intervention Not Indicated  Financial Strain Interventions Intervention Not Indicated        Care Coordination Interventions:  Yes, provided  Interventions Today    Flowsheet Row Most Recent Value  Chronic Disease   Chronic disease during today's visit Hypertension (HTN)  [Problems with Balance, mengioma of the brain]  General Interventions   General Interventions Discussed/Reviewed General Interventions Discussed, General Interventions Reviewed, Doctor Visits, Durable Medical Equipment (DME)  Doctor Visits Discussed/Reviewed Doctor Visits Discussed, Doctor Visits Reviewed, PCP, Specialist  Durable Medical Equipment (DME) BP Cuff  [cane]  PCP/Specialist Visits Compliance with follow-up visit  Exercise Interventions   Exercise Discussed/Reviewed Physical Activity  Physical Activity Discussed/Reviewed Physical Activity Reviewed, Physical Activity Discussed  Education Interventions   Education Provided Provided Education  Provided Verbal Education On When to see the doctor, Other  [blood pressure monitoring, fall precautions]  Safety Interventions   Safety Discussed/Reviewed Safety Discussed, Safety Reviewed, Home Safety, Fall Risk  Home Safety Assistive Devices  [continue PT and use cane for balance]      Follow up plan: Follow up call scheduled for 04/13/23    Encounter Outcome:  Pt. Visit Completed   Chong Sicilian, BSN, RN-BC [place]: [phone] Main #: [phone]  

## 2023-03-14 DIAGNOSIS — R2681 Unsteadiness on feet: Secondary | ICD-10-CM | POA: Diagnosis not present

## 2023-03-14 DIAGNOSIS — H401131 Primary open-angle glaucoma, bilateral, mild stage: Secondary | ICD-10-CM | POA: Diagnosis not present

## 2023-03-14 DIAGNOSIS — R29898 Other symptoms and signs involving the musculoskeletal system: Secondary | ICD-10-CM | POA: Diagnosis not present

## 2023-03-14 DIAGNOSIS — M2560 Stiffness of unspecified joint, not elsewhere classified: Secondary | ICD-10-CM | POA: Diagnosis not present

## 2023-03-14 DIAGNOSIS — R2689 Other abnormalities of gait and mobility: Secondary | ICD-10-CM | POA: Diagnosis not present

## 2023-03-16 DIAGNOSIS — M2560 Stiffness of unspecified joint, not elsewhere classified: Secondary | ICD-10-CM | POA: Diagnosis not present

## 2023-03-16 DIAGNOSIS — R29898 Other symptoms and signs involving the musculoskeletal system: Secondary | ICD-10-CM | POA: Diagnosis not present

## 2023-03-16 DIAGNOSIS — R2689 Other abnormalities of gait and mobility: Secondary | ICD-10-CM | POA: Diagnosis not present

## 2023-03-16 DIAGNOSIS — R2681 Unsteadiness on feet: Secondary | ICD-10-CM | POA: Diagnosis not present

## 2023-03-17 DIAGNOSIS — D320 Benign neoplasm of cerebral meninges: Secondary | ICD-10-CM | POA: Diagnosis not present

## 2023-03-17 DIAGNOSIS — R930 Abnormal findings on diagnostic imaging of skull and head, not elsewhere classified: Secondary | ICD-10-CM | POA: Diagnosis not present

## 2023-03-22 DIAGNOSIS — D320 Benign neoplasm of cerebral meninges: Secondary | ICD-10-CM | POA: Diagnosis not present

## 2023-03-22 DIAGNOSIS — G9389 Other specified disorders of brain: Secondary | ICD-10-CM | POA: Diagnosis not present

## 2023-03-22 DIAGNOSIS — Z510 Encounter for antineoplastic radiation therapy: Secondary | ICD-10-CM | POA: Diagnosis not present

## 2023-03-23 DIAGNOSIS — G9389 Other specified disorders of brain: Secondary | ICD-10-CM | POA: Diagnosis not present

## 2023-03-23 DIAGNOSIS — Z510 Encounter for antineoplastic radiation therapy: Secondary | ICD-10-CM | POA: Diagnosis not present

## 2023-03-23 DIAGNOSIS — D320 Benign neoplasm of cerebral meninges: Secondary | ICD-10-CM | POA: Diagnosis not present

## 2023-04-03 DIAGNOSIS — Z510 Encounter for antineoplastic radiation therapy: Secondary | ICD-10-CM | POA: Diagnosis not present

## 2023-04-03 DIAGNOSIS — G9389 Other specified disorders of brain: Secondary | ICD-10-CM | POA: Diagnosis not present

## 2023-04-03 DIAGNOSIS — D320 Benign neoplasm of cerebral meninges: Secondary | ICD-10-CM | POA: Diagnosis not present

## 2023-04-03 DIAGNOSIS — I470 Re-entry ventricular arrhythmia: Secondary | ICD-10-CM | POA: Diagnosis not present

## 2023-04-05 DIAGNOSIS — Z510 Encounter for antineoplastic radiation therapy: Secondary | ICD-10-CM | POA: Diagnosis not present

## 2023-04-05 DIAGNOSIS — G9389 Other specified disorders of brain: Secondary | ICD-10-CM | POA: Diagnosis not present

## 2023-04-05 DIAGNOSIS — D320 Benign neoplasm of cerebral meninges: Secondary | ICD-10-CM | POA: Diagnosis not present

## 2023-04-06 DIAGNOSIS — D320 Benign neoplasm of cerebral meninges: Secondary | ICD-10-CM | POA: Diagnosis not present

## 2023-04-06 DIAGNOSIS — G9389 Other specified disorders of brain: Secondary | ICD-10-CM | POA: Diagnosis not present

## 2023-04-06 DIAGNOSIS — Z510 Encounter for antineoplastic radiation therapy: Secondary | ICD-10-CM | POA: Diagnosis not present

## 2023-04-07 DIAGNOSIS — Z510 Encounter for antineoplastic radiation therapy: Secondary | ICD-10-CM | POA: Diagnosis not present

## 2023-04-07 DIAGNOSIS — G9389 Other specified disorders of brain: Secondary | ICD-10-CM | POA: Diagnosis not present

## 2023-04-07 DIAGNOSIS — D320 Benign neoplasm of cerebral meninges: Secondary | ICD-10-CM | POA: Diagnosis not present

## 2023-04-10 DIAGNOSIS — Z510 Encounter for antineoplastic radiation therapy: Secondary | ICD-10-CM | POA: Diagnosis not present

## 2023-04-10 DIAGNOSIS — G9389 Other specified disorders of brain: Secondary | ICD-10-CM | POA: Diagnosis not present

## 2023-04-10 DIAGNOSIS — D320 Benign neoplasm of cerebral meninges: Secondary | ICD-10-CM | POA: Diagnosis not present

## 2023-04-11 DIAGNOSIS — Z510 Encounter for antineoplastic radiation therapy: Secondary | ICD-10-CM | POA: Diagnosis not present

## 2023-04-11 DIAGNOSIS — G9389 Other specified disorders of brain: Secondary | ICD-10-CM | POA: Diagnosis not present

## 2023-04-11 DIAGNOSIS — D320 Benign neoplasm of cerebral meninges: Secondary | ICD-10-CM | POA: Diagnosis not present

## 2023-04-12 DIAGNOSIS — G9389 Other specified disorders of brain: Secondary | ICD-10-CM | POA: Diagnosis not present

## 2023-04-12 DIAGNOSIS — Z510 Encounter for antineoplastic radiation therapy: Secondary | ICD-10-CM | POA: Diagnosis not present

## 2023-04-12 DIAGNOSIS — D320 Benign neoplasm of cerebral meninges: Secondary | ICD-10-CM | POA: Diagnosis not present

## 2023-04-13 ENCOUNTER — Ambulatory Visit: Payer: Self-pay | Admitting: *Deleted

## 2023-04-13 ENCOUNTER — Encounter: Payer: Self-pay | Admitting: *Deleted

## 2023-04-13 DIAGNOSIS — Z510 Encounter for antineoplastic radiation therapy: Secondary | ICD-10-CM | POA: Diagnosis not present

## 2023-04-13 DIAGNOSIS — D320 Benign neoplasm of cerebral meninges: Secondary | ICD-10-CM | POA: Diagnosis not present

## 2023-04-13 DIAGNOSIS — G9389 Other specified disorders of brain: Secondary | ICD-10-CM | POA: Diagnosis not present

## 2023-04-13 NOTE — Patient Outreach (Signed)
Care Coordination   Follow Up Visit Note   04/13/2023 Name: Blake Lawson MRN: [id number] DOB: July 13, 1935  Blake Lawson is a 88 y.o. year old male who sees Blake Peri, MD for primary care. I spoke with  Blake Lawson by phone today.  What matters to the patients health and wellness today?  Fall prevention, improve balance, resume PT after radiation therapy has been completed    Goals Addressed             This Visit's Progress    Improve Balance and Decrease Falls   On track    Care Coordination Goals: Patient will use cane for balance and practice walking with that Patient will move carefully and change positions slowly to decrease risk for falls Patient will monitor and record blood pressure and pulse and notify provide of any readings outside of recommended range Patient will report any falls to provider and will seek medical attention if necessary Patient will report any new or worsening symptoms to provider or seek appropriate medical attention Patient will continue to work with radiation oncology re: meningioma of the brain Patient will continue daily radiation therapy at [facility] for 4 more weeks for a total of 6 weeks of treatment Patient will resume physical therapy once radiation treatments have ended Patient will reach out to RN Care Coordinator [phone] with any resource or care coordination needs     Manage Blood Pressure & Heart Rate   On track    Care Coordination Goals: Patient will take medications as directed and report any negative side effects to provider  Patient will use a pill box/organizer to help keep up with when to take medications Patient will monitor and record blood pressure and heart rate daily and as needed and will call PCP or specialist with any readings outside of recommended range Patient will keep all recommended follow-up appointments with PCP and specialists (cardiology, nephrology, etc) Patient will take blood pressure log to PCP  and specialty appointments for review Patient will follow a low sodium/DASH diet  Patient will reach out to RN Care Coordinator [phone] with any care coordination or resource needs         SDOH assessments and interventions completed:  Yes  SDOH Interventions Today    Flowsheet Row Most Recent Value  SDOH Interventions   Transportation Interventions Intervention Not Indicated  Financial Strain Interventions Intervention Not Indicated        Care Coordination Interventions:  Yes, provided  Interventions Today    Flowsheet Row Most Recent Value  Chronic Disease   Chronic disease during today's visit Other, Hypertension (HTN)  [Impaired balance, Meningioma of brain]  General Interventions   General Interventions Discussed/Reviewed General Interventions Discussed, General Interventions Reviewed, Doctor Visits, Durable Medical Equipment (DME)  Doctor Visits Discussed/Reviewed Doctor Visits Discussed, Doctor Visits Reviewed, Specialist  Durable Medical Equipment (DME) BP Cuff  PCP/Specialist Visits Compliance with follow-up visit  Exercise Interventions   Exercise Discussed/Reviewed Physical Activity  Physical Activity Discussed/Reviewed Physical Activity Discussed, Physical Activity Reviewed  [resume physical therapy once radiation therapy has been completed]  Education Interventions   Education Provided Provided Education  Provided Verbal Education On When to see the doctor, Other, Medication  [blood pressure monitoring]  Pharmacy Interventions   Pharmacy Dicussed/Reviewed Medications and their functions  Safety Interventions   Safety Discussed/Reviewed Safety Discussed, Safety Reviewed, Fall Risk, Home Safety  Home Safety Assistive Devices       Follow up plan: Follow up call scheduled for  05/12/23    Encounter Outcome:  Pt. Visit Completed   Blake Lawson, BSN, RN-BC RN Care Coordinator [facility]  [facility] Direct Dial: [phone] Main #:  [phone]

## 2023-04-14 DIAGNOSIS — G9389 Other specified disorders of brain: Secondary | ICD-10-CM | POA: Diagnosis not present

## 2023-04-14 DIAGNOSIS — Z299 Encounter for prophylactic measures, unspecified: Secondary | ICD-10-CM | POA: Diagnosis not present

## 2023-04-14 DIAGNOSIS — Z510 Encounter for antineoplastic radiation therapy: Secondary | ICD-10-CM | POA: Diagnosis not present

## 2023-04-14 DIAGNOSIS — I10 Essential (primary) hypertension: Secondary | ICD-10-CM | POA: Diagnosis not present

## 2023-04-14 DIAGNOSIS — E1165 Type 2 diabetes mellitus with hyperglycemia: Secondary | ICD-10-CM | POA: Diagnosis not present

## 2023-04-14 DIAGNOSIS — Z794 Long term (current) use of insulin: Secondary | ICD-10-CM | POA: Diagnosis not present

## 2023-04-14 DIAGNOSIS — D320 Benign neoplasm of cerebral meninges: Secondary | ICD-10-CM | POA: Diagnosis not present

## 2023-04-14 LAB — HEMOGLOBIN A1C: Hemoglobin A1C: 7.5

## 2023-04-17 DIAGNOSIS — Z510 Encounter for antineoplastic radiation therapy: Secondary | ICD-10-CM | POA: Diagnosis not present

## 2023-04-17 DIAGNOSIS — D320 Benign neoplasm of cerebral meninges: Secondary | ICD-10-CM | POA: Diagnosis not present

## 2023-04-17 DIAGNOSIS — G9389 Other specified disorders of brain: Secondary | ICD-10-CM | POA: Diagnosis not present

## 2023-04-18 ENCOUNTER — Ambulatory Visit: Payer: Medicare PPO | Admitting: Cardiovascular Disease

## 2023-04-18 DIAGNOSIS — D320 Benign neoplasm of cerebral meninges: Secondary | ICD-10-CM | POA: Diagnosis not present

## 2023-04-18 DIAGNOSIS — Z510 Encounter for antineoplastic radiation therapy: Secondary | ICD-10-CM | POA: Diagnosis not present

## 2023-04-18 DIAGNOSIS — G9389 Other specified disorders of brain: Secondary | ICD-10-CM | POA: Diagnosis not present

## 2023-04-19 DIAGNOSIS — G9389 Other specified disorders of brain: Secondary | ICD-10-CM | POA: Diagnosis not present

## 2023-04-19 DIAGNOSIS — Z510 Encounter for antineoplastic radiation therapy: Secondary | ICD-10-CM | POA: Diagnosis not present

## 2023-04-19 DIAGNOSIS — D320 Benign neoplasm of cerebral meninges: Secondary | ICD-10-CM | POA: Diagnosis not present

## 2023-04-20 DIAGNOSIS — D320 Benign neoplasm of cerebral meninges: Secondary | ICD-10-CM | POA: Diagnosis not present

## 2023-04-20 DIAGNOSIS — G9389 Other specified disorders of brain: Secondary | ICD-10-CM | POA: Diagnosis not present

## 2023-04-20 DIAGNOSIS — Z510 Encounter for antineoplastic radiation therapy: Secondary | ICD-10-CM | POA: Diagnosis not present

## 2023-04-21 DIAGNOSIS — G9389 Other specified disorders of brain: Secondary | ICD-10-CM | POA: Diagnosis not present

## 2023-04-21 DIAGNOSIS — D320 Benign neoplasm of cerebral meninges: Secondary | ICD-10-CM | POA: Diagnosis not present

## 2023-04-21 DIAGNOSIS — Z510 Encounter for antineoplastic radiation therapy: Secondary | ICD-10-CM | POA: Diagnosis not present

## 2023-04-24 DIAGNOSIS — G9389 Other specified disorders of brain: Secondary | ICD-10-CM | POA: Diagnosis not present

## 2023-04-24 DIAGNOSIS — D320 Benign neoplasm of cerebral meninges: Secondary | ICD-10-CM | POA: Diagnosis not present

## 2023-04-24 DIAGNOSIS — Z510 Encounter for antineoplastic radiation therapy: Secondary | ICD-10-CM | POA: Diagnosis not present

## 2023-04-25 DIAGNOSIS — Z510 Encounter for antineoplastic radiation therapy: Secondary | ICD-10-CM | POA: Diagnosis not present

## 2023-04-25 DIAGNOSIS — D320 Benign neoplasm of cerebral meninges: Secondary | ICD-10-CM | POA: Diagnosis not present

## 2023-04-25 DIAGNOSIS — G9389 Other specified disorders of brain: Secondary | ICD-10-CM | POA: Diagnosis not present

## 2023-04-26 DIAGNOSIS — Z510 Encounter for antineoplastic radiation therapy: Secondary | ICD-10-CM | POA: Diagnosis not present

## 2023-04-26 DIAGNOSIS — D320 Benign neoplasm of cerebral meninges: Secondary | ICD-10-CM | POA: Diagnosis not present

## 2023-04-26 DIAGNOSIS — G9389 Other specified disorders of brain: Secondary | ICD-10-CM | POA: Diagnosis not present

## 2023-04-27 DIAGNOSIS — Z510 Encounter for antineoplastic radiation therapy: Secondary | ICD-10-CM | POA: Diagnosis not present

## 2023-04-27 DIAGNOSIS — G9389 Other specified disorders of brain: Secondary | ICD-10-CM | POA: Diagnosis not present

## 2023-04-27 DIAGNOSIS — D320 Benign neoplasm of cerebral meninges: Secondary | ICD-10-CM | POA: Diagnosis not present

## 2023-04-28 DIAGNOSIS — Z510 Encounter for antineoplastic radiation therapy: Secondary | ICD-10-CM | POA: Diagnosis not present

## 2023-04-28 DIAGNOSIS — D320 Benign neoplasm of cerebral meninges: Secondary | ICD-10-CM | POA: Diagnosis not present

## 2023-04-28 DIAGNOSIS — G9389 Other specified disorders of brain: Secondary | ICD-10-CM | POA: Diagnosis not present

## 2023-05-01 ENCOUNTER — Other Ambulatory Visit: Payer: Medicare PPO

## 2023-05-01 ENCOUNTER — Ambulatory Visit: Payer: Medicare PPO | Attending: Cardiovascular Disease | Admitting: Internal Medicine

## 2023-05-01 ENCOUNTER — Telehealth: Payer: Self-pay | Admitting: Internal Medicine

## 2023-05-01 ENCOUNTER — Other Ambulatory Visit: Payer: Self-pay | Admitting: Internal Medicine

## 2023-05-01 ENCOUNTER — Encounter: Payer: Self-pay | Admitting: Internal Medicine

## 2023-05-01 ENCOUNTER — Encounter: Payer: Self-pay | Admitting: *Deleted

## 2023-05-01 VITALS — BP 118/64 | HR 61 | Ht 74.0 in | Wt 182.0 lb

## 2023-05-01 DIAGNOSIS — I493 Ventricular premature depolarization: Secondary | ICD-10-CM | POA: Insufficient documentation

## 2023-05-01 DIAGNOSIS — D320 Benign neoplasm of cerebral meninges: Secondary | ICD-10-CM | POA: Diagnosis not present

## 2023-05-01 DIAGNOSIS — I491 Atrial premature depolarization: Secondary | ICD-10-CM | POA: Diagnosis not present

## 2023-05-01 DIAGNOSIS — E785 Hyperlipidemia, unspecified: Secondary | ICD-10-CM | POA: Insufficient documentation

## 2023-05-01 DIAGNOSIS — E7849 Other hyperlipidemia: Secondary | ICD-10-CM | POA: Diagnosis not present

## 2023-05-01 DIAGNOSIS — I739 Peripheral vascular disease, unspecified: Secondary | ICD-10-CM | POA: Diagnosis not present

## 2023-05-01 DIAGNOSIS — R001 Bradycardia, unspecified: Secondary | ICD-10-CM | POA: Insufficient documentation

## 2023-05-01 DIAGNOSIS — R42 Dizziness and giddiness: Secondary | ICD-10-CM

## 2023-05-01 DIAGNOSIS — I10 Essential (primary) hypertension: Secondary | ICD-10-CM | POA: Diagnosis not present

## 2023-05-01 DIAGNOSIS — G9389 Other specified disorders of brain: Secondary | ICD-10-CM | POA: Diagnosis not present

## 2023-05-01 DIAGNOSIS — Z510 Encounter for antineoplastic radiation therapy: Secondary | ICD-10-CM | POA: Diagnosis not present

## 2023-05-01 NOTE — Telephone Encounter (Signed)
14 Day ZIO AT dx: dizziness, bradycardia

## 2023-05-01 NOTE — Patient Instructions (Addendum)
Medication Instructions:  Your physician recommends that you continue on your current medications as directed. Please refer to the Current Medication list given to you today.  Labwork: none  Testing/Procedures: 14 Day ZIO AT heart monitor-this will be mailed to your home address. Please start monitor after 2 weeks   Follow-Up: Your physician recommends that you schedule a follow-up appointment in: pending  Any Other Special Instructions Will Be Listed Below (If Applicable).  If you need a refill on your cardiac medications before your next appointment, please call your pharmacy.

## 2023-05-01 NOTE — Progress Notes (Addendum)
Cardiology Office Note  Date: 05/01/2023   ID: Blake Lawson, DOB 1935-01-13, MRN [id number]  PCP:  [name], MD  Cardiologist:  None Electrophysiologist:  None   Reason for Office Visit: Evaluation of abnormal EKG at the request of Dr. Sherryll Burger   History of Present Illness: Blake Lawson is a 88 y.o. 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  Patient's wife reported most of the history. His heart rate was noted to be between 30-44 in the last 1 month after which the metoprolol was cut down from 50 mg to 25 mg.  On 25 mg metoprolol, his heart rate improved to 60s. I do not have the EKG from the PCP to review. I reviewed the EKG performed today that showed NSR, PACs and PVCs. The patient reported palpitations 1-2 times per month, not frequent. He also has lightheadedness from sitting to standing position and also sometimes with walking.  Denies any angina, DOE, syncope. No leg swelling. Drinks decaffeinated coffee.  Denies alcohol use, smoking use.  He still has 2 more weeks of radiation therapy to his brain for meningioma.  He underwent surgery in 2022.  Recurrent claudication, he reported having leg heaviness a few years ago but denied any claudication symptoms now.  Past Medical History:  Diagnosis Date   Arthritis    Asthma    Diabetes mellitus, type II (HCC)    DVT (deep venous thrombosis) (HCC)    Hyperlipidemia    Hypertension    Stroke (HCC) 05/2021   stroke in the right eye    Past Surgical History:  Procedure Laterality Date   APPLICATION OF CRANIAL NAVIGATION Right 09/10/2021   Procedure: APPLICATION OF CRANIAL NAVIGATION;  Surgeon: Lisbeth Renshaw, MD;  Location: [facility] OR;  Service: Neurosurgery;  Laterality: Right;   CHOLECYSTECTOMY     CRANIOTOMY Right 09/10/2021   Procedure:  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;  Surgeon: Lisbeth Renshaw, MD;  Location: [facility] OR;  Service: Neurosurgery;  Laterality: Right;   TRANSURETHRAL RESECTION OF PROSTATE      Current Outpatient Medications  Medication Sig Dispense Refill   albuterol (VENTOLIN HFA) 108 (90 Base) MCG/ACT inhaler Inhale 2 puffs into the lungs every 6 (six) hours as needed for wheezing or shortness of breath.     amLODipine (NORVASC) 5 MG tablet Take 5 mg by mouth daily.     apixaban (ELIQUIS) 5 MG TABS tablet Take 1 tablet (5 mg total) by mouth 2 (two) times daily. 60 tablet    atorvastatin (LIPITOR) 40 MG tablet Take 40 mg by mouth daily.     clotrimazole (LOTRIMIN) 1 % cream Apply 1 application topically daily.     empagliflozin (JARDIANCE) 25 MG TABS tablet Take 25 mg by mouth daily.     insulin NPH-regular Human (HUMULIN 70/30) (70-30) 100 UNIT/ML injection Inject 25-35 Units into the skin 2 (two) times daily with a meal. Per sliding Scale     latanoprost (XALATAN) 0.005 % ophthalmic solution Place 1 drop into both eyes at bedtime.     levocetirizine (XYZAL) 5 MG tablet 5 mg as needed.     losartan (COZAAR) 100 MG tablet Take 100 mg by mouth daily.     metFORMIN (GLUCOPHAGE) 500 MG tablet Take 1,000 mg by mouth 2 (two) times daily with  a meal.     metoprolol succinate (TOPROL-XL) 50 MG 24 hr tablet Take 25 mg by mouth daily.     No current facility-administered medications for this visit.   Allergies:  Patient has no known allergies.   Social History: The patient  reports that he has quit smoking. His smoking use included cigarettes. He has never used smokeless tobacco. He reports current alcohol use. He reports that he does not use drugs.   Family History: The patient's family history includes Diabetes in his father and mother; Hypertension in his mother.   ROS:  Please see the history of present illness. Otherwise, complete review of systems is positive for none  All other  systems are reviewed and negative.   Physical Exam: VS:  BP 118/64   Pulse 61   Ht 6\' 2"  (1.88 m)   Wt 182 lb (82.6 kg)   BMI 23.37 kg/m , BMI Body mass index is 23.37 kg/m.  Wt Readings from Last 3 Encounters:  05/01/23 182 lb (82.6 kg)  03/08/23 185 lb 9.6 oz (84.2 kg)  09/10/21 181 lb (82.1 kg)    General: Patient appears comfortable at rest. HEENT: Conjunctiva and lids normal, oropharynx clear with moist mucosa. Neck: Supple, no elevated JVP or carotid bruits, no thyromegaly. Lungs: Clear to auscultation, nonlabored breathing at rest. Cardiac: Regular rate and rhythm, no S3 or significant systolic murmur, no pericardial rub. Abdomen: Soft, nontender, no hepatomegaly, bowel sounds present, no guarding or rebound. Extremities: No pitting edema, distal pulses 2+. Skin: Warm and dry. Musculoskeletal: No kyphosis. Neuropsychiatric: Alert and oriented x3, affect grossly appropriate.  Recent Labwork: No results found for requested labs within last 365 days.  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  # PACs and PVCs # Sinus bradycardia -Patient's HR dropped to 30-40s on metoprolol succinate 50 mg once daily. After decreasing the dose to 25 mg, HR improved to 60s. EKG from today showed NSR, PVCs and PACs. He  complains of lightheadedness sometimes with exertion but usually positional. Will obtain 2-week event monitor to rule out any bradycardia arrhythmias. He has 2 more weeks of radiation therapy to his brain, he will get 2-week event monitor after completion of his radiation therapy. patient has occasional palpitations, 1-2 times per month, will continue metoprolol succinate 25 mg once daily for PVC/PAC management.  # Severe PAD -Ultrasound arterial Doppler lower extremities in 2022 showed severe PAD bilaterally.  Patient had symptoms of leg heaviness bilaterally a few years ago but he denied any symptoms recently. Not on aspirin due to Eliquis use. Continue high intensity statin, atorvastatin 40 mg nightly. Continue losartan 100 mg once daily. If he develops symptoms of claudication, instructed him to keep walking and refer to supervised exercise therapy for PAD after obtaining ABIs.  # CAD (LVEF 50 to 55% with RWMA): Not on aspirin due to Eliquis use, continue high intensity statin,  atorvastatin 40 mg nightly.  He denies any angina, will see him back in 6 months and reevaluate his symptoms.  # HLD: Continue atorvastatin 40 mg nightly, goal LDL less than 70.  # HTN, controlled: Continue amlodipine 5 mg once daily, losartan 100 mg once daily and metoprolol succinate 25 mg once daily. HTN management per PCP.   I have spent a total of 45 minutes with patient reviewing chart, EKGs, labs and examining patient as well as establishing an assessment and plan that was discussed with the patient.  > 50% of time was spent in direct patient care.    Medication Adjustments/Labs and Tests Ordered: Current medicines are reviewed at length with the patient today.  Concerns regarding medicines are outlined above.   Tests Ordered: Orders Placed This Encounter  Procedures   EKG 12-Lead    Medication Changes: No orders of the defined types were placed in this encounter.   Disposition:  Follow up  pending  results  Signed Marri Mcneff Verne Spurr, MD, 05/01/2023 8:53 AM    [facility] at [facility] [address] [place], [place], [place] [address]

## 2023-05-02 DIAGNOSIS — D320 Benign neoplasm of cerebral meninges: Secondary | ICD-10-CM | POA: Diagnosis not present

## 2023-05-02 DIAGNOSIS — G9389 Other specified disorders of brain: Secondary | ICD-10-CM | POA: Diagnosis not present

## 2023-05-02 DIAGNOSIS — Z510 Encounter for antineoplastic radiation therapy: Secondary | ICD-10-CM | POA: Diagnosis not present

## 2023-05-03 DIAGNOSIS — Z510 Encounter for antineoplastic radiation therapy: Secondary | ICD-10-CM | POA: Diagnosis not present

## 2023-05-03 DIAGNOSIS — G9389 Other specified disorders of brain: Secondary | ICD-10-CM | POA: Diagnosis not present

## 2023-05-03 DIAGNOSIS — D320 Benign neoplasm of cerebral meninges: Secondary | ICD-10-CM | POA: Diagnosis not present

## 2023-05-04 DIAGNOSIS — G9389 Other specified disorders of brain: Secondary | ICD-10-CM | POA: Diagnosis not present

## 2023-05-04 DIAGNOSIS — Z510 Encounter for antineoplastic radiation therapy: Secondary | ICD-10-CM | POA: Diagnosis not present

## 2023-05-04 DIAGNOSIS — D320 Benign neoplasm of cerebral meninges: Secondary | ICD-10-CM | POA: Diagnosis not present

## 2023-05-05 DIAGNOSIS — Z510 Encounter for antineoplastic radiation therapy: Secondary | ICD-10-CM | POA: Diagnosis not present

## 2023-05-05 DIAGNOSIS — G9389 Other specified disorders of brain: Secondary | ICD-10-CM | POA: Diagnosis not present

## 2023-05-05 DIAGNOSIS — D320 Benign neoplasm of cerebral meninges: Secondary | ICD-10-CM | POA: Diagnosis not present

## 2023-05-09 DIAGNOSIS — G9389 Other specified disorders of brain: Secondary | ICD-10-CM | POA: Diagnosis not present

## 2023-05-09 DIAGNOSIS — D320 Benign neoplasm of cerebral meninges: Secondary | ICD-10-CM | POA: Diagnosis not present

## 2023-05-09 DIAGNOSIS — Z510 Encounter for antineoplastic radiation therapy: Secondary | ICD-10-CM | POA: Diagnosis not present

## 2023-05-10 DIAGNOSIS — Z510 Encounter for antineoplastic radiation therapy: Secondary | ICD-10-CM | POA: Diagnosis not present

## 2023-05-10 DIAGNOSIS — D320 Benign neoplasm of cerebral meninges: Secondary | ICD-10-CM | POA: Diagnosis not present

## 2023-05-10 DIAGNOSIS — G9389 Other specified disorders of brain: Secondary | ICD-10-CM | POA: Diagnosis not present

## 2023-05-11 DIAGNOSIS — D320 Benign neoplasm of cerebral meninges: Secondary | ICD-10-CM | POA: Diagnosis not present

## 2023-05-11 DIAGNOSIS — Z510 Encounter for antineoplastic radiation therapy: Secondary | ICD-10-CM | POA: Diagnosis not present

## 2023-05-11 DIAGNOSIS — G9389 Other specified disorders of brain: Secondary | ICD-10-CM | POA: Diagnosis not present

## 2023-05-12 ENCOUNTER — Ambulatory Visit: Payer: Self-pay | Admitting: *Deleted

## 2023-05-12 ENCOUNTER — Encounter: Payer: Self-pay | Admitting: *Deleted

## 2023-05-12 DIAGNOSIS — Z510 Encounter for antineoplastic radiation therapy: Secondary | ICD-10-CM | POA: Diagnosis not present

## 2023-05-12 DIAGNOSIS — G9389 Other specified disorders of brain: Secondary | ICD-10-CM | POA: Diagnosis not present

## 2023-05-12 DIAGNOSIS — D320 Benign neoplasm of cerebral meninges: Secondary | ICD-10-CM | POA: Diagnosis not present

## 2023-05-12 NOTE — Patient Outreach (Signed)
Care Coordination   Follow Up Visit Note   05/12/2023 Name: Blake Lawson MRN: [id number] DOB: March 02, 1935  Blake Lawson is a 88 y.o. year old male who sees [name], MD for primary care. I spoke with  Blake Lawson by phone today.  What matters to the patients health and wellness today?  Improving strength, energy, and balance    Goals Addressed             This Visit's Progress    Improve Balance and Decrease Falls   On track    Care Coordination Goals: Patient will use cane for balance and practice walking with that Patient will move carefully and change positions slowly to decrease risk for falls Patient will monitor and record blood pressure and pulse and notify provide of any readings outside of recommended range Patient will report any falls to provider and will seek medical attention if necessary Patient will report any new or worsening symptoms to provider or seek appropriate medical attention Patient will continue to work with radiation oncology re: meningioma of the brain Patient will continue daily radiation therapy at [facility] for one more session Patient will resume physical therapy once radiation treatments have ended Patient will reach out to RN Care Coordinator [phone] with any resource or care coordination needs     Manage Blood Pressure & Heart Rate   On track    Care Coordination Goals: Patient will take medications as directed and report any negative side effects to provider  Patient will use a pill box/organizer to help keep up with when to take medications Patient will monitor and record blood pressure and heart rate daily and as needed and will call PCP or specialist with any readings outside of recommended range Patient will keep all recommended follow-up appointments with PCP and specialists (cardiology, nephrology, etc) Scheduled with Cardio in November to f/u on echo from 04/2023 LVEF 50-55% Patient will take blood pressure log to PCP and  specialty appointments for review Patient will follow a low sodium/DASH diet  Patient will reach out to RN Care Coordinator [phone] with any care coordination or resource needs         SDOH assessments and interventions completed:  No     Care Coordination Interventions:  Yes, provided  Interventions Today    Flowsheet Row Most Recent Value  Chronic Disease   Chronic disease during today's visit Other, Hypertension (HTN)  [PAD, Meningioma of Brain]  General Interventions   General Interventions Discussed/Reviewed General Interventions Discussed, General Interventions Reviewed, Labs, Durable Medical Equipment (DME), Doctor Visits  [reviewed and discussed echo results]  Doctor Visits Discussed/Reviewed Doctor Visits Discussed, Doctor Visits Reviewed, Specialist, PCP  Durable Medical Equipment (DME) Walker, BP Cuff  PCP/Specialist Visits Compliance with follow-up visit  Exercise Interventions   Exercise Discussed/Reviewed Exercise Discussed, Exercise Reviewed, Physical Activity  Physical Activity Discussed/Reviewed Physical Activity Discussed, Physical Activity Reviewed, Home Exercise Program (HEP)  Education Interventions   Education Provided Provided Education  Provided Verbal Education On Mental Health/Coping with Illness, When to see the doctor, Exercise, Medication  [monitoring and record BP and HR. Resume PT after radiation is finished]  Pharmacy Interventions   Pharmacy Dicussed/Reviewed Pharmacy Topics Discussed, Pharmacy Topics Reviewed, Medications and their functions  Safety Interventions   Safety Discussed/Reviewed Safety Discussed, Safety Reviewed, Home Safety, Fall Risk  Home Safety Assistive Devices       Follow up plan: Follow up call scheduled for 06/12/23    Encounter Outcome:  Pt. Visit  Completed   Blake Lawson, BSN, RN-BC RN Care Coordinator [facility]  [facility] Direct Dial: [phone] Main #: [phone]

## 2023-05-15 DIAGNOSIS — D320 Benign neoplasm of cerebral meninges: Secondary | ICD-10-CM | POA: Diagnosis not present

## 2023-05-15 DIAGNOSIS — Z510 Encounter for antineoplastic radiation therapy: Secondary | ICD-10-CM | POA: Diagnosis not present

## 2023-05-15 DIAGNOSIS — G9389 Other specified disorders of brain: Secondary | ICD-10-CM | POA: Diagnosis not present

## 2023-05-16 DIAGNOSIS — Z510 Encounter for antineoplastic radiation therapy: Secondary | ICD-10-CM | POA: Diagnosis not present

## 2023-05-16 DIAGNOSIS — G9389 Other specified disorders of brain: Secondary | ICD-10-CM | POA: Diagnosis not present

## 2023-05-16 DIAGNOSIS — D320 Benign neoplasm of cerebral meninges: Secondary | ICD-10-CM | POA: Diagnosis not present

## 2023-05-17 DIAGNOSIS — D320 Benign neoplasm of cerebral meninges: Secondary | ICD-10-CM | POA: Diagnosis not present

## 2023-05-17 DIAGNOSIS — G9389 Other specified disorders of brain: Secondary | ICD-10-CM | POA: Diagnosis not present

## 2023-05-17 DIAGNOSIS — Z510 Encounter for antineoplastic radiation therapy: Secondary | ICD-10-CM | POA: Diagnosis not present

## 2023-05-23 DIAGNOSIS — R42 Dizziness and giddiness: Secondary | ICD-10-CM | POA: Diagnosis not present

## 2023-05-24 DIAGNOSIS — R42 Dizziness and giddiness: Secondary | ICD-10-CM | POA: Diagnosis not present

## 2023-06-12 ENCOUNTER — Ambulatory Visit: Payer: Self-pay | Admitting: *Deleted

## 2023-06-12 ENCOUNTER — Encounter: Payer: Self-pay | Admitting: *Deleted

## 2023-06-12 NOTE — Patient Outreach (Signed)
Care Coordination   Follow Up Visit Note   06/12/2023 Name: Blake Lawson MRN: [id number] DOB: [birth year]  Blake Lawson is a 88 y.o. year old male who sees Blake Peri, MD for primary care. I spoke with  Blake Lawson by phone today.  What matters to the patients health and wellness today?  Managing blood pressure, heart rate, blood sugar, and preventing falls    Goals Addressed             This Visit's Progress    Improve Balance and Decrease Falls   On track    Care Coordination Goals: Patient will use cane for balance and practice walking with that Patient will move carefully and change positions slowly to decrease risk for falls Patient will monitor and record blood pressure and pulse and notify provide of any readings outside of recommended range Patient will report any falls to provider and will seek medical attention if necessary Patient will report any new or worsening symptoms to provider or seek appropriate medical attention Patient will continue to work with radiation oncology re: meningioma of the brain Patient will continue daily radiation therapy at [facility] for one more session Patient will resume physical therapy once radiation treatments have ended Patient will reach out to RN Care Coordinator [phone] with any resource or care coordination needs     Manage Blood Pressure & Heart Rate   On track    Care Coordination Goals: Patient will take medications as directed and report any negative side effects to provider  Patient will use a pill box/organizer to help keep up with when to take medications Patient will monitor and record blood pressure and heart rate daily and as needed and will call PCP or specialist with any readings outside of recommended range Patient will keep all recommended follow-up appointments with PCP and specialists (cardiology, nephrology, etc) Scheduled with Cardio in November to f/u on echo from 04/2023 LVEF 50-55% Patient will  follow-up with cardiology office in [place] re: results of heart monitor that he mailed in on 06/02/23 Patient will take blood pressure log to PCP and specialty appointments for review Patient will follow a low sodium/DASH diet  Patient will reach out to RN Care Coordinator [phone] with any care coordination or resource needs      Manage Blood Sugar       Care Coordination Goals: Patient will follow-up with PCP every 3 months or as recommended Patient will take medication as prescribed and reach out to provider with any negative side effects Patient will continue to monitor and record blood sugar daily and as needed with glucometer, and will call PCP with any readings outside of recommended range Patient will take blood sugar log and meter to provider visits for review Patient will follow a modified carbohydrate diet and decrease simple carbohydrates and sugars Patient will increase activity level as tolerated with an ultimate goal of at least 150 minutes of exercise per week Patient will check feet daily for sores, wounds, calluses, etc and will notify provider of any abnormal findings Patient will have yearly eye exams to check for or monitor diabetic retinopathy Patient will reach out to RN Care Coordinator [phone] with any care coordination or resource needs         SDOH assessments and interventions completed:  Yes  SDOH Interventions Today    Flowsheet Row Most Recent Value  SDOH Interventions   Transportation Interventions Intervention Not Indicated  Financial Strain Interventions Intervention Not Indicated  Care Coordination Interventions:  Yes, provided  Interventions Today    Flowsheet Row Most Recent Value  Chronic Disease   Chronic disease during today's visit Hypertension (HTN), Diabetes  General Interventions   General Interventions Discussed/Reviewed General Interventions Discussed, General Interventions Reviewed, Durable Medical Equipment (DME),  Doctor Visits, Communication with, Labs, Annual Eye Exam, Annual Foot Exam  [Patient reports home blood pressure reading of 145/60, Pulse 97 and Blood sugar of 140 this morning]  Labs Hgb A1c every 3 months  Doctor Visits Discussed/Reviewed Doctor Visits Discussed, Specialist, Doctor Visits Reviewed, PCP  Durable Medical Equipment (DME) BP Cuff, Glucomoter  PCP/Specialist Visits Compliance with follow-up visit  [scheduled with Dr Jenene Slicker on 11/09/23. Follow-up with her office sooner re: results of heart monitor that was mailed in on 06/02/23]  Communication with PCP/Specialists  [facility] re: results of heat monitor that was mailed back on 06/02/23. Results may not be available yet.]  Exercise Interventions   Exercise Discussed/Reviewed Physical Activity  Physical Activity Discussed/Reviewed Physical Activity Discussed, Physical Activity Reviewed  Education Interventions   Education Provided Provided Education  Provided Verbal Education On Medication, Mental Health/Coping with Illness, When to see the doctor, Blood Sugar Monitoring, Labs  [blood pressure and heart rate monitoring. Take log to provider visits]  Labs Reviewed Hgb A1c  [7.5 on 04/14/23 at [facility]]  Nutrition Interventions   Nutrition Discussed/Reviewed Nutrition Reviewed, Nutrition Discussed  Pharmacy Interventions   Pharmacy Dicussed/Reviewed Pharmacy Topics Discussed, Pharmacy Topics Reviewed, Medications and their functions  [metoprolol dose was cut from 50mg  to 25mg  at last cardio visit. Heart rate has improved. It was ranging from 30-50 prior to lowering dose. Heart monitor results pending.]  Safety Interventions   Safety Discussed/Reviewed Safety Discussed, Safety Reviewed, Fall Risk, Home Safety  Home Safety Assistive Devices       Follow up plan: Follow up call scheduled for 07/12/23    Encounter Outcome:  Pt. Visit Completed   Demetrios Loll, BSN, RN-BC RN Care Coordinator [facility]  [facility] Direct Dial: [phone] Main #: [phone]

## 2023-06-15 DIAGNOSIS — Z510 Encounter for antineoplastic radiation therapy: Secondary | ICD-10-CM | POA: Diagnosis not present

## 2023-06-15 DIAGNOSIS — D320 Benign neoplasm of cerebral meninges: Secondary | ICD-10-CM | POA: Diagnosis not present

## 2023-06-15 DIAGNOSIS — G9389 Other specified disorders of brain: Secondary | ICD-10-CM | POA: Diagnosis not present

## 2023-06-30 DIAGNOSIS — R5383 Other fatigue: Secondary | ICD-10-CM | POA: Diagnosis not present

## 2023-06-30 DIAGNOSIS — Z299 Encounter for prophylactic measures, unspecified: Secondary | ICD-10-CM | POA: Diagnosis not present

## 2023-06-30 DIAGNOSIS — D6869 Other thrombophilia: Secondary | ICD-10-CM | POA: Diagnosis not present

## 2023-06-30 DIAGNOSIS — I10 Essential (primary) hypertension: Secondary | ICD-10-CM | POA: Diagnosis not present

## 2023-06-30 DIAGNOSIS — D320 Benign neoplasm of cerebral meninges: Secondary | ICD-10-CM | POA: Diagnosis not present

## 2023-06-30 DIAGNOSIS — Z0000 Encounter for general adult medical examination without abnormal findings: Secondary | ICD-10-CM | POA: Diagnosis not present

## 2023-06-30 DIAGNOSIS — E7800 Pure hypercholesterolemia, unspecified: Secondary | ICD-10-CM | POA: Diagnosis not present

## 2023-07-04 ENCOUNTER — Telehealth: Payer: Self-pay | Admitting: Internal Medicine

## 2023-07-04 NOTE — Telephone Encounter (Signed)
Patients spouse called to follow up on heart monitor results.

## 2023-07-06 NOTE — Telephone Encounter (Signed)
Spoke with wife [facility]) regarding recommendations of Amiodarone and Cardiac CTA. She stated that she didn't want to start him on any medications or order test until she could either see you earlier or wait until November to discuss this matter. Concerned about the side affects and him getting worse. He is having balance issues and is tired a lot. Would you like to move his appointment up or can he wait until November to see you. Please Advise

## 2023-07-07 ENCOUNTER — Encounter: Payer: Self-pay | Admitting: Internal Medicine

## 2023-07-07 NOTE — Telephone Encounter (Signed)
Patient to follow up in August and holding off on medication and testing until then. Patient verbalized understanding.

## 2023-07-12 ENCOUNTER — Encounter: Payer: Self-pay | Admitting: *Deleted

## 2023-07-18 ENCOUNTER — Encounter: Payer: Self-pay | Admitting: *Deleted

## 2023-07-18 ENCOUNTER — Ambulatory Visit: Payer: Self-pay | Admitting: *Deleted

## 2023-07-19 NOTE — Patient Outreach (Signed)
Care Coordination   Follow Up Visit Note   07/19/2023 Name: Blake Lawson MRN: [id number] DOB: 11/12/1935  Blake Lawson is a 88 y.o. year old male who sees [name], MD for primary care. I  spoke with wife, [name], by telephone today.  What matters to the patients health and wellness today?  Wife is concerned about memory loss/cognitive deficits    Goals Addressed             This Visit's Progress    Manage Blood Pressure & Heart Rate   On track    Care Coordination Goals: Patient will take medications as directed and report any negative side effects to provider  Patient will monitor and record blood pressure and heart rate daily and as needed and will call PCP or specialist with any readings outside of recommended range Patient will keep all recommended follow-up appointments with PCP and specialists (cardiology, nephrology, etc) Scheduled with Cardio on 07/26/23 Patient [name] will discuss results of heart monitor at Cardio visit Patient will take blood pressure log to PCP and specialty appointments for review Patient will follow a low sodium/DASH diet  Patient will reach out to RN Care Coordinator [phone] with any care coordination or resource needs      Manage Blood Sugar   Not on track    Care Coordination Goals: Patient will follow-up with PCP every 3 months or as recommended Patient will take medication as prescribed and reach out to provider with any negative side effects Patient will monitor blood sugar before dosing and administering meal time insulin Patient will monitor and record blood sugar 4 times daily and as needed with glucometer, and will call PCP with any readings outside of recommended range Patient will take blood sugar log and meter to provider visits for review Patient will follow a modified carbohydrate diet and decrease simple carbohydrates and sugars Patient will eat meals at regular intervals and not skip meals Patient will increase  activity level as tolerated with an ultimate goal of at least 150 minutes of exercise per week Patient will reach out to RN Care Coordinator [phone] with any care coordination or resource needs      Wife's Goal: Work on Memory       Care Coordination Goals: Patient/wife will talk with PCP about memory loss and cognition changes Patient will take medications as prescribed Patient will use pillbox with removal days to manage medications Patient will not inject insulin without checking blood sugar first and dosing by sliding scale Patient will increase activity level as tolerated with a goal of at least 150 minutes per week Patient will eat meals regularly and not skip meals Patient will increase social interactions if possible Patient/wife will reach out to RN Care Coordinator at [phone] with any resource or care coordination needs         SDOH assessments and interventions completed:  Yes  SDOH Interventions Today    Flowsheet Row Most Recent Value  SDOH Interventions   Transportation Interventions Intervention Not Indicated  Financial Strain Interventions Intervention Not Indicated        Care Coordination Interventions:  Yes, provided  Interventions Today    Flowsheet Row Most Recent Value  Chronic Disease   Chronic disease during today's visit Diabetes, Hypertension (HTN), Other  [Memory loss/cognitive deficit, hx of meningioma of the brain]  General Interventions   General Interventions Discussed/Reviewed General Interventions Discussed, General Interventions Reviewed, Labs, Durable Medical Equipment (DME), Communication with, Doctor Visits  [wife reports  blood pressure is stable. Today's readings 145/60, O2 Sat 97%, Heart rate 64. Blood sugar readings unavailable. Patient is not checking regularly.]  Labs Hgb A1c every 3 months  Doctor Visits Discussed/Reviewed Doctor Visits Discussed, Specialist, PCP, Doctor Visits Reviewed  [name] with PCP about memory  loss/cognitivie deficits and wife's concerns about his driving ability, poor appetite, not checking blood sugar before dosing and administering insulin, & other things like leaving the stove on & water running (flooded kitchen)]  Durable Medical Equipment (DME) Glucomoter, BP Cuff, Other  [pulse ox]  PCP/Specialist Visits Compliance with follow-up visit  [cardio, Dr Jenene Slicker on 07/26/23]  Communication with PCP/Specialists  [secure fax through [facility] sent to PCP re: memory loss/cognitive changes & staff mesage sent to cardio. Wife questions if heart function can caffect memory.]  Exercise Interventions   Exercise Discussed/Reviewed Exercise Discussed, Exercise Reviewed, Physical Activity  [No exercise. Lays in bed & sleeps most of the day. Did go to bible study today with neighbor. Wife is doing yardwork. Has always been very active. Drives to [employer] on Sundays for church. Wears him out & he sleeps on Monday.]  Physical Activity Discussed/Reviewed Physical Activity Discussed, Physical Activity Reviewed  Education Interventions   Education Provided Provided Education  Provided Verbal Education On Blood Sugar Monitoring, Labs, Nutrition, When to see the doctor, Mental Health/Coping with Illness, Exercise, Medication, Other  [some signs/symptos of dementia]  Labs Reviewed Hgb A1c  [04/14/23 A1C 7.5]  Mental Health Interventions   Mental Health Discussed/Reviewed Mental Health Discussed, Mental Health Reviewed, Depression  [wife questions if he may have depression. Encouraged to discuss with PCP. Reports that he has always been very active and hyper but now he just sleeps most of the time. Isn't sure if it's depression or physical problem.]  Nutrition Interventions   Nutrition Discussed/Reviewed Nutrition Discussed, Nutrition Reviewed, Carbohydrate meal planning, Increasing proteins, Supplemental nutrition, Fluid intake, Adding fruits and vegetables  [Eat 3 well balanced meals with a limit of 30  grams of carbs and a snack in between if needed witha max of 15 grams of carbs. Should eat at regular intervals with about the same amount of carbs each time. Do not skip meals.]  Pharmacy Interventions   Pharmacy Dicussed/Reviewed Pharmacy Topics Discussed, Pharmacy Topics Reviewed, Medications and their functions  Safety Interventions   Safety Discussed/Reviewed Safety Discussed, Safety Reviewed, Fall Risk, Home Safety  Home Safety --  [Recommend against driving, especially long distances alone. Has had MMSE at PCP office per wife. Needs further workup.]       Follow up plan: Follow up call scheduled for 08/01/23    Encounter Outcome:  Pt. Visit Completed   Demetrios Loll, BSN, RN-BC RN Care Coordinator [facility]  [facility] Direct Dial: [phone] Main #: [phone]

## 2023-07-26 ENCOUNTER — Encounter: Payer: Self-pay | Admitting: Internal Medicine

## 2023-07-26 ENCOUNTER — Ambulatory Visit: Payer: Medicare PPO | Attending: Internal Medicine | Admitting: Internal Medicine

## 2023-07-26 VITALS — BP 112/68 | HR 59 | Ht 74.0 in | Wt 163.2 lb

## 2023-07-26 DIAGNOSIS — I493 Ventricular premature depolarization: Secondary | ICD-10-CM

## 2023-07-26 NOTE — Patient Instructions (Addendum)
Medication Instructions:  Your physician recommends that you continue on your current medications as directed. Please refer to the Current Medication list given to you today.   Labwork: BMET to be done at [facility] at least 2 weeks prior to the Coronary CTA  Testing/Procedures:   Your cardiac CT will be scheduled at one of the below locations:   [facility] [address] [place], [place] [address] [phone]  If scheduled at [facility], please arrive at the [facility] (Entrance C2) of [facility] 30 minutes prior to test start time. You can use the FREE valet parking offered at entrance C (encouraged to control the heart rate for the test)  Proceed to the [facility] Radiology Department (first floor) to check-in and test prep.  All radiology patients and guests should use entrance C2 at [facility], accessed from [facility], even though the hospital's physical address listed is [address]     There is spacious parking and easy access to the radiology department from the [facility] entrance. Please enter here and check-in with the desk attendant.   Please follow these instructions carefully (unless otherwise directed):  An IV will be required for this test and Nitroglycerin will be given.  Hold all erectile dysfunction medications at least 3 days (72 hrs) prior to test. (Ie viagra, cialis, sildenafil, tadalafil, etc)   On the Night Before the Test: Be sure to Drink plenty of water. Do not consume any caffeinated/decaffeinated beverages or chocolate 12 hours prior to your test. Do not take any antihistamines 12 hours prior to your test.  On the Day of the Test: Drink plenty of water until 1 hour prior to the test. Do not eat any food 1 hour prior to test. You may take your regular medications prior to the test.  Take metoprolol succinate 25 mg two hours prior to test. If you  take Furosemide/Hydrochlorothiazide/Spironolactone, please HOLD on the morning of the test.      After the Test: Drink plenty of water. After receiving IV contrast, you may experience a mild flushed feeling. This is normal. On occasion, you may experience a mild rash up to 24 hours after the test. This is not dangerous. If this occurs, you can take Benadryl 25 mg and increase your fluid intake. If you experience trouble breathing, this can be serious. If it is severe call 911 IMMEDIATELY. If it is mild, please call our office. If you take any of these medications: Metformin and Jardiance, please do not take 48 hours after completing test unless otherwise instructed.  We will call to schedule your test 2-4 weeks out understanding that some insurance companies will need an authorization prior to the service being performed.   For more information and frequently asked questions, please visit our website : [email or web address]  For non-scheduling related questions, please contact the cardiac imaging nurse navigator should you have any questions/concerns: Cardiac Imaging Nurse Navigators Direct Office Dial: [phone]   For scheduling needs, including cancellations and rescheduling, please call [place], [phone].   Follow-Up: Your physician recommends that you schedule a follow-up appointment in: 6 months  Any Other Special Instructions Will Be Listed Below (If Applicable).  If you need a refill on your cardiac medications before your next appointment, please call your pharmacy.

## 2023-07-27 NOTE — Progress Notes (Signed)
Cardiology Office Note  Date: 07/27/2023   ID: Blake Lawson, DOB 1934/12/27, MRN [id number]  PCP:  [name], MD  Cardiologist:  Marjo Bicker, MD Electrophysiologist:  None    History of Present Illness: Blake Lawson is a 88 y.o. 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  Patient was referred to cardiology clinic in 5/24 for palpitations.  Event monitor from 6/24 showed PVC burden 19.7%, 13 SVT runs and 28 NSVT runs. He was on metoprolol succinate 25 mg once daily (initially was on 50 mg and the dose was decreased to 25 mg due to severe sinus bradycardia). Patient refused amiodarone due to side effects.  He is here for follow-up visit accompanied by wife.  Denies any interval palpitations. Although he reports almost daily intermittent dizziness due to which has a high fall risk.  Denies angina, DOE, orthopnea, PND, leg swelling.  Denies any claudication.  Past Medical History:  Diagnosis Date   Arthritis    Asthma    Diabetes mellitus, type II (HCC)    DVT (deep venous thrombosis) (HCC)    Hyperlipidemia    Hypertension    Stroke (HCC) 05/2021   stroke in the right eye    Past Surgical History:  Procedure Laterality Date   APPLICATION OF CRANIAL NAVIGATION Right 09/10/2021   Procedure: APPLICATION OF CRANIAL NAVIGATION;  Surgeon: Lisbeth Renshaw, MD;  Location: [facility] OR;  Service: Neurosurgery;  Laterality: Right;   CHOLECYSTECTOMY     CRANIOTOMY Right 09/10/2021   Procedure: 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;  Surgeon: Lisbeth Renshaw, MD;  Location[facility] OR;  Service: Neurosurgery;  Laterality: Right;   TRANSURETHRAL RESECTION OF PROSTATE      Current Outpatient Medications  Medication Sig Dispense Refill   albuterol (VENTOLIN HFA) 108 (90 Base) MCG/ACT inhaler Inhale 2 puffs into the lungs  every 6 (six) hours as needed for wheezing or shortness of breath.     amLODipine (NORVASC) 5 MG tablet Take 5 mg by mouth daily.     apixaban (ELIQUIS) 5 MG TABS tablet Take 1 tablet (5 mg total) by mouth 2 (two) times daily. 60 tablet    atorvastatin (LIPITOR) 40 MG tablet Take 40 mg by mouth daily.     clotrimazole (LOTRIMIN) 1 % cream Apply 1 application topically daily.     empagliflozin (JARDIANCE) 25 MG TABS tablet Take 25 mg by mouth daily.     insulin NPH-regular Human (HUMULIN 70/30) (70-30) 100 UNIT/ML injection Inject 25-35 Units into the skin 2 (two) times daily with a meal. Per sliding Scale     latanoprost (XALATAN) 0.005 % ophthalmic solution Place 1 drop into both eyes at bedtime.     levocetirizine (XYZAL) 5 MG tablet 5 mg as needed.     losartan (COZAAR) 100 MG tablet Take 100 mg by mouth daily.     metFORMIN (GLUCOPHAGE) 500 MG tablet Take 1,000 mg by mouth 2 (two) times daily with a meal.     metoprolol succinate (TOPROL-XL) 50 MG 24 hr tablet Take 25 mg by mouth daily.     No current facility-administered medications for this visit.   Allergies:  Patient has no known allergies.   Social History: The patient  reports that he has quit smoking. His smoking use included cigarettes. He has never used smokeless tobacco. He reports  current alcohol use. He reports that he does not use drugs.   Family History: The patient's family history includes Diabetes in his father and mother; Hypertension in his mother.   ROS:  Please see the history of present illness. Otherwise, complete review of systems is positive for none  All other systems are reviewed and negative.   Physical Exam: VS:  BP 112/68   Pulse (!) 59   Ht 6\' 2"  (1.88 m)   Wt 163 lb 3.2 oz (74 kg)   SpO2 100%   BMI 20.95 kg/m , BMI Body mass index is 20.95 kg/m.  Wt Readings from Last 3 Encounters:  07/26/23 163 lb 3.2 oz (74 kg)  05/01/23 182 lb (82.6 kg)  03/08/23 185 lb 9.6 oz (84.2 kg)    General: Patient  appears comfortable at rest. HEENT: Conjunctiva and lids normal, oropharynx clear with moist mucosa. Neck: Supple, no elevated JVP or carotid bruits, no thyromegaly. Lungs: Clear to auscultation, nonlabored breathing at rest. Cardiac: Regular rate and rhythm, no S3 or significant systolic murmur, no pericardial rub. Abdomen: Soft, nontender, no hepatomegaly, bowel sounds present, no guarding or rebound. Extremities: No pitting edema, distal pulses 2+. Skin: Warm and dry. Musculoskeletal: No kyphosis. Neuropsychiatric: Alert and oriented x3, affect grossly appropriate.  Recent Labwork: No results found for requested labs within last 365 days.  No results found for: "CHOL", "TRIG", "HDL", "CHOLHDL", "VLDL", "LDLCALC", "LDLDIRECT"  Other Studies Reviewed Today: Addendum on 05/09/2023 Echocardiogram on 03/2023 LVEF 50 to 55% with RWMA (basal anterior, basal anterolateral, basal inferior, basal inferoseptal, mid anterior, mid anterolateral, mid inferior and mid inferoseptal hypokinesis) Left atrium mildly dilated Right atrium normal in size RV normal in size Mild MR Mild to moderate PR  Assessment and Plan:  # PVC burden, 19.7% # CDM LVEF 50 to 55% with RWMA -Patient was initially referred to cardiology clinic for evaluation of palpitations, event monitor from 6/24 showed 19.7% PVC burden, 28 NSVT times and 13 SVT runs. He is currently on metoprolol succinate 25 mg once daily. Denies any interval palpitations but reports almost daily intermittent dizziness. No angina or DOE. Will obtain CTA cardiac for ischemia evaluation due to significant PVC burden and LVEF 50 to 55% with RWMA on echocardiogram. Will place referral to electrophysiology due to inability to uptitrate rate controlling agents due to sinus bradycardia, HR 50s and patient refused amiodarone due to side effects.  # Severe PAD, claudication free -[facility] arterial Doppler LE in 2022 showed severe PAD bilaterally.  He had bilateral  claudication a few years ago but denies any symptoms recently.  Not on aspirin due to Eliquis use.  Continue high intensity statin, atorvastatin 40 mg nightly.  Continue losartan 100 mg once daily.  If he develops symptoms of claudication, instructed him to keep walking.  # CAD (LVEF 50 to 55% with RWMA):  -Denies any angina or DOE.  Will obtain CTA cardiac for ischemia evaluation.  # HLD: Continue atorvastatin 40 mg nightly, goal LDL less than 55 (due to PAD and CVA).  # HTN, controlled: Continue amlodipine 5 mg once daily, losartan 100 mg once daily and metoprolol succinate 25 mg once daily.   I have spent a total of 30 minutes with patient reviewing chart, EKGs, labs and examining patient as well as establishing an assessment and plan that was discussed with the patient.  > 50% of time was spent in direct patient care.    Medication Adjustments/Labs and Tests Ordered: Current medicines are reviewed at  length with the patient today.  Concerns regarding medicines are outlined above.   Tests Ordered: Orders Placed This Encounter  Procedures   CT CORONARY MORPH W/CTA COR W/SCORE W/CA W/CM &/OR WO/CM   Basic metabolic panel   Ambulatory referral to Cardiac Electrophysiology    Medication Changes: No orders of the defined types were placed in this encounter.   Disposition:  Follow up 6 months  Signed Tico Crotteau Verne Spurr, MD, 07/27/2023 9:29 AM    [facility] at [facility] [address] [place], [place], [place] [address]

## 2023-07-31 ENCOUNTER — Telehealth (HOSPITAL_COMMUNITY): Payer: Self-pay | Admitting: *Deleted

## 2023-07-31 DIAGNOSIS — Z794 Long term (current) use of insulin: Secondary | ICD-10-CM | POA: Diagnosis not present

## 2023-07-31 DIAGNOSIS — I10 Essential (primary) hypertension: Secondary | ICD-10-CM | POA: Diagnosis not present

## 2023-07-31 DIAGNOSIS — E1165 Type 2 diabetes mellitus with hyperglycemia: Secondary | ICD-10-CM | POA: Diagnosis not present

## 2023-07-31 DIAGNOSIS — Z299 Encounter for prophylactic measures, unspecified: Secondary | ICD-10-CM | POA: Diagnosis not present

## 2023-07-31 NOTE — Telephone Encounter (Signed)
Patient's wife returning call about his  upcoming cardiac imaging study; pt's wife verbalizes understanding of appt date/time, parking situation and where to check in, pre-test NPO status, and verified current allergies; name and call back number provided for further questions should they arise  Larey Brick RN Navigator Cardiac Imaging [facility] [phone] office [phone] cell  Patient's wife aware that patient is to arrive at 1:30 PM.

## 2023-07-31 NOTE — Telephone Encounter (Signed)
Attempted to call patient regarding upcoming cardiac CT appointment. Left message on voicemail with name and callback number Hayley Sharpe RN Navigator Cardiac Imaging [facility] [phone] Office   

## 2023-07-31 NOTE — Telephone Encounter (Signed)
Insurance auth pending.  CCTA r/s for 8/13.  Spoke to wife, aware of new appt time/date.   Patient will complete labs prior to CT.

## 2023-08-01 ENCOUNTER — Ambulatory Visit: Payer: Self-pay | Admitting: *Deleted

## 2023-08-01 ENCOUNTER — Ambulatory Visit (HOSPITAL_COMMUNITY): Admission: RE | Admit: 2023-08-01 | Payer: Medicare PPO | Source: Ambulatory Visit

## 2023-08-01 ENCOUNTER — Encounter: Payer: Self-pay | Admitting: *Deleted

## 2023-08-01 NOTE — Patient Outreach (Signed)
Care Coordination   Follow Up Visit Note   08/01/2023 Name: CASETON HALABY MRN: [id number] DOB: April 16, 1935  RAYNALDO WO is a 88 y.o. year old male who sees [name], MD for primary care. I  spoke with patient's wife, [name], by telephone today.  What matters to the patients health and wellness today?  Managing heart condition and finding out why his memory is not as good as it used to be    Goals Addressed             This Visit's Progress    Improve Balance and Decrease Falls   On track    Care Coordination Goals: Patient will use cane for balance and practice walking with that Patient will move carefully and change positions slowly to decrease risk for falls Patient will monitor and record blood pressure and pulse and notify provide of any readings outside of recommended range Patient will report any falls to provider and will seek medical attention if necessary Patient will report any new or worsening symptoms to provider or seek appropriate medical attention Patient will consider resuming physical therapy and talk with PCP Patient will reach out to RN Care Coordinator [phone] with any resource or care coordination needs     Manage Blood Pressure & Heart Rate   On track    Care Coordination Goals: Patient will take medications as directed and report any negative side effects to provider  Patient will monitor and record blood pressure and heart rate daily and as needed and will call PCP or specialist with any readings outside of recommended range Patient will keep all recommended follow-up appointments with PCP, specialists, and for imaging or testing Cardiac CT scheduled for 08/08/23 Patient will take blood pressure log to PCP and specialty appointments for review Patient will follow a low sodium/DASH diet  Patient will reach out to RN Care Coordinator [phone] with any care coordination or resource needs      Manage Blood Sugar   On track    Care  Coordination Goals: Patient will follow-up with PCP every 3 months or as recommended Patient will take medication as prescribed and reach out to provider with any negative side effects Patient will monitor blood sugar before dosing and administering meal time insulin Patient will monitor and record blood sugar 4 times daily and as needed with glucometer, and will call PCP with any readings outside of recommended range Patient will take blood sugar log and meter to provider visits for review Patient will eat meals at regular intervals and not skip meals Patient will increase activity level as tolerated with an ultimate goal of at least 150 minutes of exercise per week Patient will reach out to RN Care Coordinator [phone] with any care coordination or resource needs      Wife's Goal: Work on Memory   On track    Care Coordination Goals: Patient/wife will talk with PCP about memory loss and cognition changes Patient will take medications as prescribed Due to decreased appetite and food intake, wife has cut metformin back to 1 in the morning and 2 in the evening. She'll discuss with PCP. Patient will not inject insulin without checking blood sugar first and dosing by sliding scale Patient will increase activity level as tolerated with a goal of at least 150 minutes per week Patient will eat meals regularly and not skip meals Patient will increase social interactions if possible Patient/wife will reach out to RN Care Coordinator at [phone] with any resource  or care coordination needs         SDOH assessments and interventions completed:  Yes  SDOH Interventions Today    Flowsheet Row Most Recent Value  SDOH Interventions   Housing Interventions Intervention Not Indicated  Transportation Interventions Patient Resources (Friends/Family)  Financial Strain Interventions Intervention Not Indicated        Care Coordination Interventions:  Yes, provided  Interventions Today     Flowsheet Row Most Recent Value  Chronic Disease   Chronic disease during today's visit Diabetes, Hypertension (HTN), Other  [PVCs, Memory Loss]  General Interventions   General Interventions Discussed/Reviewed General Interventions Discussed, General Interventions Reviewed, Doctor Visits, Durable Medical Equipment (DME)  Labs Hgb A1c every 3 months  Doctor Visits Discussed/Reviewed Doctor Visits Discussed, Doctor Visits Reviewed, PCP, Annual Wellness Visits, Specialist  [reviewed and discussed recent cardiology visit, treatment plan, and follow-up recommendations]  Durable Medical Equipment (DME) BP Cuff, Glucomoter  [BP at cardiology visit on 07/26/23 was 112/68, pulse 58. Wife did not have home blood pressure readings available but remembered pulse was 60. Wife did not have blood sugar readings available for review but reports that they have been about the same]  PCP/Specialist Visits Compliance with follow-up visit  [cardiac [facility] at [facility] scheduled for 08/08/23. Follow-up with cardiologist Re: results will be scheduled. F/U with regular cardiologist is scheduled for 10/2023. Encouraged to F/U with PCP Re: memory issues.]  Exercise Interventions   Exercise Discussed/Reviewed Exercise Discussed, Exercise Reviewed, Physical Activity  [Still remains mostly inactive. Does go to church but doesn't do much around the home and doesn't exercise. Encouraged increased physical activity if cleared by cardiology. Discuss restarting physical therapy with cardiologist and PCP.]  Physical Activity Discussed/Reviewed Physical Activity Discussed, Physical Activity Reviewed  Education Interventions   Education Provided Provided Education  Provided Verbal Education On Blood Sugar Monitoring, Nutrition, When to see the doctor, Exercise, Medication, Other  [blood pressure and heart rate monitoring, effect of exercise on brain health]  Labs Reviewed Hgb A1c  [04/14/23 A1C 7.5%]  Nutrition Interventions   Nutrition  Discussed/Reviewed Nutrition Discussed, Nutrition Reviewed, Carbohydrate meal planning, Adding fruits and vegetables, Increasing proteins, Fluid intake, Portion sizes, Decreasing sugar intake, Supplemental nutrition  [decreased appetite but it is better than it was right after radiation. Wife says he seems to get nauseated while eating but he doesn't endorse this.]  Pharmacy Interventions   Pharmacy Dicussed/Reviewed Pharmacy Topics Discussed, Pharmacy Topics Reviewed, Medications and their functions  [Did not start adenosine due to potential side effects. Discussed with cardilogist. Ok to hold. Do not take insulin without checking blood sugar and planning to eat. Wife cut metformin down to 1 tab in the AM and 2 in the PM. Advised to talk with PCP.]  Safety Interventions   Safety Discussed/Reviewed Safety Discussed, Safety Reviewed, Fall Risk, Home Safety  Home Safety Assistive Devices  [Again encouraged to F/U with PCP Re: memory concerns and ask for further assessment. Avoid driving due to dizziness and memory concerns. Use a cane for balance.]       Follow up plan: Follow up call scheduled for 09/01/23    Encounter Outcome:  Pt. Visit Completed   Demetrios Loll, BSN, RN-BC RN Care Coordinator [facility]  [facility] Direct Dial: [phone] Main #: [phone]

## 2023-08-03 DIAGNOSIS — Z79899 Other long term (current) drug therapy: Secondary | ICD-10-CM | POA: Diagnosis not present

## 2023-08-03 DIAGNOSIS — Z125 Encounter for screening for malignant neoplasm of prostate: Secondary | ICD-10-CM | POA: Diagnosis not present

## 2023-08-03 DIAGNOSIS — R5383 Other fatigue: Secondary | ICD-10-CM | POA: Diagnosis not present

## 2023-08-03 DIAGNOSIS — E7800 Pure hypercholesterolemia, unspecified: Secondary | ICD-10-CM | POA: Diagnosis not present

## 2023-08-08 ENCOUNTER — Ambulatory Visit (HOSPITAL_COMMUNITY): Payer: Medicare PPO

## 2023-08-09 ENCOUNTER — Other Ambulatory Visit: Payer: Medicare PPO

## 2023-09-01 ENCOUNTER — Encounter: Payer: Self-pay | Admitting: Cardiovascular Disease

## 2023-09-01 ENCOUNTER — Encounter: Payer: Self-pay | Admitting: *Deleted

## 2023-09-01 ENCOUNTER — Ambulatory Visit: Payer: Medicare PPO | Attending: Cardiovascular Disease | Admitting: Cardiovascular Disease

## 2023-09-01 ENCOUNTER — Ambulatory Visit: Payer: Self-pay | Admitting: *Deleted

## 2023-09-01 VITALS — BP 126/62 | HR 59 | Ht 74.0 in | Wt 165.4 lb

## 2023-09-01 DIAGNOSIS — I493 Ventricular premature depolarization: Secondary | ICD-10-CM | POA: Diagnosis not present

## 2023-09-01 MED ORDER — MAGNESIUM 400 MG PO TABS: 400.0000 mg | ORAL_TABLET | Freq: Every day | ORAL | Status: AC

## 2023-09-01 NOTE — Patient Instructions (Addendum)
Medication Instructions:   Begin Magnesium Taurate 400mg  daily OR per package directions  Continue all other medications.     Labwork:  none  Testing/Procedures:  none  Follow-Up:  Follow up with Mealor no longer needed Keep follow up with Dr. Jenene Slicker as planned   Any Other Special Instructions Will Be Listed Below (If Applicable).   If you need a refill on your cardiac medications before your next appointment, please call your pharmacy.

## 2023-09-01 NOTE — Patient Outreach (Signed)
Care Coordination   Follow Up Visit Note   09/01/2023 Name: Blake Lawson MRN: [id number] DOB: February 15, 1935  Blake Lawson is a 88 y.o. year old male who sees [name], MD for primary care. I  spoke with wife, Blake Lawson, by telephone today.  She was not with the patient at time of call.   What matters to the patients health and wellness today?  Managing all medical conditions in the setting of mild neurocognitive disorder    Goals Addressed             This Visit's Progress    Manage Blood Pressure & Heart Rate       Care Coordination Goals: Patient will take medications as directed and report any negative side effects to provider  Patient will monitor and record blood pressure and heart rate daily and as needed and will call PCP or specialist with any readings outside of recommended range Patient will keep all recommended follow-up appointments with PCP, specialists, and for imaging or testing Appointment with electrophysiologist on 09/01/23 Insurance did not approve Cardiac CT Patient will take blood pressure log to PCP and specialty appointments for review Patient will follow a low sodium/DASH diet  Patient will reach out to RN Care Coordinator [phone] with any care coordination or resource needs      Manage Blood Sugar   Not on track    Care Coordination Goals: Patient will take medication as prescribed and reach out to provider with any negative side effects Patient will monitor blood sugar before dosing and administering meal time insulin Patient will monitor and record blood sugar 4 times daily and as needed with glucometer, and will call PCP with any readings outside of recommended range Patient will take blood sugar log and meter to provider visits for review Patient will eat meals at regular intervals and not skip meals Patient will increase activity level as tolerated with an ultimate goal of at least 150 minutes of exercise per week Patient will reach out to RN  Care Coordinator [phone] with any care coordination or resource needs      Wife's Goal: Work on Memory   Not on track    Care Coordination Goals: Patient/wife will talk with PCP about memory loss and cognition changes Patient will take medications as prescribed Continue to use weekly pill box and only take medication on the appropriate day and time Patient will not inject insulin without checking blood sugar first and dosing by sliding scale Patient will increase activity level as tolerated with a goal of at least 150 minutes per week Patient will eat meals regularly and not skip meals Patient will increase social interactions if possible Patient/wife will reach out to RN Care Coordinator at [phone] with any resource or care coordination needs         SDOH assessments and interventions completed:  Yes  SDOH Interventions Today    Flowsheet Row Most Recent Value  SDOH Interventions   Transportation Interventions Intervention Not Indicated  Health Literacy Interventions Intervention Not Indicated  [wife assists]      Care Coordination Interventions:  Yes, provided  Interventions Today    Flowsheet Row Most Recent Value  Chronic Disease   Chronic disease during today's visit Diabetes, Hypertension (HTN), Other  [neurocognitive disorder]  General Interventions   General Interventions Discussed/Reviewed General Interventions Discussed, General Interventions Reviewed, Durable Medical Equipment (DME), Doctor Visits, Labs  Labs Hgb A1c every 3 months  Doctor Visits Discussed/Reviewed Doctor Visits Discussed, Doctor Visits  Reviewed, PCP, Specialist  [discussed visit with PCP on 07/31/23]  Durable Medical Equipment (DME) BP Cuff, Glucomoter  [Patient is not checking blood pressure or blood sugar regularly but blood sugar has been "normal" when they have checked it.]  PCP/Specialist Visits Compliance with follow-up visit  [Dr Mealor (electrophysiologist) today]  Exercise  Interventions   Exercise Discussed/Reviewed Physical Activity, Exercise Discussed, Exercise Reviewed  [Does not exercicse. Has difficulty with balance. Use cane or walker.]  Physical Activity Discussed/Reviewed Physical Activity Discussed, Physical Activity Reviewed  Education Interventions   Education Provided Provided Education  Provided Verbal Education On Nutrition, When to see the doctor, Exercise, Medication, Blood Sugar Monitoring, Labs, [employer], Mental Health/Coping with Illness, Other  [monitor and record blood pressure daily. Importance of monitoring blood sugar, especially before administering insulin.]  Labs Reviewed Hgb A1c  [07/31/23 A1C 6.8%. 08/03/23 Hgb 10.6]  Mental Health Interventions   Mental Health Discussed/Reviewed Mental Health Discussed, Mental Health Reviewed  [Encouraged to talk with social worker Re: resources for level of care needs. Wife will reach out when needed. Discussed caregiver strain and need to take time for herself. Patient has decreased memory and is argumentative.]  Nutrition Interventions   Nutrition Discussed/Reviewed Nutrition Discussed, Nutrition Reviewed, Adding fruits and vegetables, Portion sizes, Increasing proteins  [patient has a decreased appetite and decreased taste since having covid and radiation.  Does not enjoy meals as he used to. Wife prepares meals and encourages him to eat. Needs iron rich foods due to anemia]  Pharmacy Interventions   Pharmacy Dicussed/Reviewed Pharmacy Topics Discussed, Pharmacy Topics Reviewed, Medications and their functions  [wife prepares medications in a weekly pill box with removal boxes. At times he will get the entire box and may mix up the days or times of day and take medication at the wrong time. may help to put it somewhere out of reach.]  Safety Interventions   Safety Discussed/Reviewed Safety Discussed, Safety Reviewed, Fall Risk, Home Safety  Home Safety Assistive Devices  Advanced Directive  Interventions   Advanced Directives Discussed/Reviewed Guardianship  Guardianship --  [Wife is not at the point of wanting to obtain guardianship but information on how to start that process started. Encouraged to talk with LCSW if she would like additional information.]      Follow up plan: Follow up call scheduled for 09/18/23    Encounter Outcome:  Patient Visit Completed   Demetrios Loll, RN, BSN Care Management Coordinator [facility]  [facility] Direct Dial: [phone] Main #: [phone]

## 2023-09-01 NOTE — Progress Notes (Signed)
  Electrophysiology Office Note:    Date:  09/01/2023   ID:  Blake Lawson, DOB 1935/01/12, MRN [id number]  PCP:  [name], MD   [facility] Providers Cardiologist:  Marjo Bicker, MD     Referring MD: Marjo Bicker, MD   History of Present Illness:    Blake Lawson is a 88 y.o. male with a medical history significant for hypertension, type 2 diabetes, DVT, stroke, severe peripheral arterial disease, referred for management of PVCs.     The patient was referred to cardiology clinic initially for palpitations.  Monitor showed a PVC burden of about 20%.  He was started on metoprolol 50 mg, but the dose was decreased to 25 due to severe sinus bradycardia.  Amiodarone was discussed, but the patient declined due to potential side effects.     Today, he has no acute complaints.  He denies having any palpitations.  He does notice dizziness upon standing.  This seems to resolve after a few minutes.  This dizziness is not sporadic, nor does it occur while he is seated.  EKGs/Labs/Other Studies Reviewed Today:    Echocardiogram:  TTE 04/03/2023 EF 55% with regional wall motion abnormalities.   Monitors:  14-day ZIO monitor July 2024 Sinus rhythm, average heart rate 79 bpm 19.7% PVC burden with occasional ventricular runs and episodes of bigeminy. No patient triggered events reported  Stress testing:   Advanced imaging:   Cardiac catherization   EKG:   EKG Interpretation Date/Time:  Friday September 01 2023 15:08:46 EDT Ventricular Rate:  61 PR Interval:  154 QRS Duration:  88 QT Interval:  428 QTC Calculation: 430 R Axis:   4  Text Interpretation: Sinus rhythm with sinus arrhythmia with frequent Premature ventricular complexes ST & T wave abnormality, consider inferolateral ischemia When compared with ECG of 08-Sep-2021 10:53, Premature ventricular complexes are now Present Confirmed by York Pellant [phone]) on 09/01/2023 3:19:38 PM      Physical Exam:    VS:  BP 126/62   Pulse (!) 59   Ht 6\' 2"  (1.88 m)   Wt 165 lb 6.4 oz (75 kg)   SpO2 97%   BMI 21.24 kg/m     Wt Readings from Last 3 Encounters:  09/01/23 165 lb 6.4 oz (75 kg)  07/26/23 163 lb 3.2 oz (74 kg)  05/01/23 182 lb (82.6 kg)     GEN: Well nourished, well developed in no acute distress CARDIAC: RRR, no murmurs, rubs, gallops RESPIRATORY:  Normal work of breathing MUSCULOSKELETAL: no edema    ASSESSMENT & PLAN:    Frequent PVCs - multiple morphologies Burden about 20% on monitor No symptoms attributable to PVCs EF is preserved I do not see a strong indication for intervention; risk of antiarrhythmic drug would likely outweigh the benefit He is not a candidate for ablation I recommended magnesium taurate OTC. Take as directed; usually no more than 400mg  daily Continue metoprolol 25  Dizziness Per his description, seems to be orthostatic rather than related to PVCs Symptoms are mild and well-managed with standing up slowly  History of vascular disease I do not think he is a candidate for flecainide      Signed, Maurice Small, MD  09/01/2023 3:35 PM     [facility]

## 2023-09-14 DIAGNOSIS — H401132 Primary open-angle glaucoma, bilateral, moderate stage: Secondary | ICD-10-CM | POA: Diagnosis not present

## 2023-09-18 ENCOUNTER — Encounter: Payer: Self-pay | Admitting: *Deleted

## 2023-09-18 ENCOUNTER — Ambulatory Visit: Payer: Self-pay | Admitting: *Deleted

## 2023-09-19 NOTE — Patient Outreach (Signed)
Care Coordination   Follow Up Visit Note   09/18/2023 Name: Blake Lawson MRN: [id number] DOB: [birth year]  Blake Lawson is a 88 y.o. year old male who sees Blake Peri, MD for primary care. I  spoke with wife, Blake Lawson, by telephone today.   What matters to the patients health and wellness today?  Improvement in memory and activity level    Goals Addressed             This Visit's Progress    Manage Blood Pressure & Heart Rate   On track    Care Coordination Goals: Patient will monitor and record blood pressure and heart rate daily and as needed and will call PCP or specialist with any readings outside of recommended range Patient will keep all recommended follow-up appointments with PCP, specialists, and for imaging or testing Patient will take blood pressure log to PCP and specialty appointments for review Patient will reach out to RN Care Coordinator [phone] with any care coordination or resource needs      Wife's Goal: Work on Memory   On track    Care Coordination Goals: Patient/wife will talk with PCP regarding any new or worsening symptoms Patient will take medications as prescribed Continue to use weekly pill box and only take medication on the appropriate day and time Patient will increase activity level as tolerated with a goal of at least 150 minutes per week Patient will increase social interactions if possible Patient/wife will reach out to RN Care Coordinator at [phone] with any resource or care coordination needs         SDOH assessments and interventions completed:  Yes  SDOH Interventions Today    Flowsheet Row Most Recent Value  SDOH Interventions   Transportation Interventions Intervention Not Indicated  Financial Strain Interventions Intervention Not Indicated        Care Coordination Interventions:  Yes, provided  Interventions Today    Flowsheet Row Most Recent Value  Chronic Disease   Chronic disease during today's visit  Hypertension (HTN), Other  [neurocognitive disorder]  General Interventions   General Interventions Discussed/Reviewed General Interventions Discussed, General Interventions Reviewed, Durable Medical Equipment (DME), Vaccines, Doctor Visits  [Feeling better and has more energy since jardiance and lipitor were discontinued. He drove to church yesterday and rested in the afternoon. He's been up today and is watching television.]  Vaccines COVID-19, Flu, Pneumonia  Doctor Visits Discussed/Reviewed Doctor Visits Discussed, Doctor Visits Reviewed, Specialist, PCP  [Reviewed & discussed electrophysiology visit on 09/01/23]  Durable Medical Equipment (DME) BP Cuff  [Blood pressure 126/62, Heart rate 59]  PCP/Specialist Visits Compliance with follow-up visit  [Dr [facility] (cardiology) scheduled for 11/09/23]  Exercise Interventions   Exercise Discussed/Reviewed Exercise Reviewed, Physical Activity, Exercise Discussed  Physical Activity Discussed/Reviewed Physical Activity Discussed, Physical Activity Reviewed  [does not exercise. uses a cane or walker for balance. Encouraged to increase physical activity as tolerated with an ultimate goal of 150 minutes per week if possible.]  Education Interventions   Education Provided Provided Education  Provided Verbal Education On Nutrition, When to see the doctor, Medication, Exercise  [seek medical attention for any new or worsening symptoms]  Mental Health Interventions   Mental Health Discussed/Reviewed Mental Health Discussed, Mental Health Reviewed, Other  [Neurocognitive disorder. Wife has some caregiver strain but reports today that things have improved some over the past couple of weeks. Patient has been more alert and active.]  Nutrition Interventions   Nutrition Discussed/Reviewed Nutrition Discussed, Nutrition Reviewed, Decreasing  salt, Increasing proteins, Adding fruits and vegetables, Carbohydrate meal planning, Supplemental nutrition  [Iron rich foods,  eat 3 meals a day with 30 grams of carbohydrates and up to 2 snacks per day with less than 15 grams of carbohydrates. Do not skip meals. Supplement with Glucenera or Boost without sugar. Low sodium.]  Pharmacy Interventions   Pharmacy Dicussed/Reviewed Pharmacy Topics Discussed, Pharmacy Topics Reviewed, Medications and their functions  [Wife reports that lipitor and jardiance were discontinued. She continues to prepare his medications weekly in a pill box.]  Safety Interventions   Safety Discussed/Reviewed Safety Discussed, Safety Reviewed, Fall Risk, Home Safety  Home Safety Assistive Devices       Follow up plan: Follow up call scheduled for 10/18/23    Encounter Outcome:  Patient Visit Completed   Demetrios Loll, RN, BSN Care Management Coordinator [facility]  [facility] Direct Dial: [phone] Main #: [phone]

## 2023-09-22 DIAGNOSIS — Z299 Encounter for prophylactic measures, unspecified: Secondary | ICD-10-CM | POA: Diagnosis not present

## 2023-09-22 DIAGNOSIS — J302 Other seasonal allergic rhinitis: Secondary | ICD-10-CM | POA: Diagnosis not present

## 2023-09-22 DIAGNOSIS — I10 Essential (primary) hypertension: Secondary | ICD-10-CM | POA: Diagnosis not present

## 2023-09-22 DIAGNOSIS — H6123 Impacted cerumen, bilateral: Secondary | ICD-10-CM | POA: Diagnosis not present

## 2023-09-22 DIAGNOSIS — Z23 Encounter for immunization: Secondary | ICD-10-CM | POA: Diagnosis not present

## 2023-09-27 DIAGNOSIS — H34811 Central retinal vein occlusion, right eye, with macular edema: Secondary | ICD-10-CM | POA: Diagnosis not present

## 2023-10-04 DIAGNOSIS — G319 Degenerative disease of nervous system, unspecified: Secondary | ICD-10-CM | POA: Diagnosis not present

## 2023-10-04 DIAGNOSIS — H748X1 Other specified disorders of right middle ear and mastoid: Secondary | ICD-10-CM | POA: Diagnosis not present

## 2023-10-04 DIAGNOSIS — D320 Benign neoplasm of cerebral meninges: Secondary | ICD-10-CM | POA: Diagnosis not present

## 2023-10-04 DIAGNOSIS — Z8673 Personal history of transient ischemic attack (TIA), and cerebral infarction without residual deficits: Secondary | ICD-10-CM | POA: Diagnosis not present

## 2023-10-12 DIAGNOSIS — D320 Benign neoplasm of cerebral meninges: Secondary | ICD-10-CM | POA: Diagnosis not present

## 2023-10-13 DIAGNOSIS — D320 Benign neoplasm of cerebral meninges: Secondary | ICD-10-CM | POA: Diagnosis not present

## 2023-10-13 DIAGNOSIS — Z299 Encounter for prophylactic measures, unspecified: Secondary | ICD-10-CM | POA: Diagnosis not present

## 2023-10-13 DIAGNOSIS — E1169 Type 2 diabetes mellitus with other specified complication: Secondary | ICD-10-CM | POA: Diagnosis not present

## 2023-10-13 DIAGNOSIS — D6869 Other thrombophilia: Secondary | ICD-10-CM | POA: Diagnosis not present

## 2023-10-13 DIAGNOSIS — I10 Essential (primary) hypertension: Secondary | ICD-10-CM | POA: Diagnosis not present

## 2023-10-18 ENCOUNTER — Ambulatory Visit: Payer: Self-pay | Admitting: *Deleted

## 2023-10-18 ENCOUNTER — Encounter: Payer: Self-pay | Admitting: *Deleted

## 2023-10-18 NOTE — Patient Outreach (Signed)
Care Coordination   Follow Up Visit Note   10/18/2023 Name: Blake Lawson MRN: [id number] DOB: October 22, 1935  Blake Lawson is a 88 y.o. year old male who sees Blake Peri, MD for primary care. I  spoke with patient's wife, Blake Lawson, by telephone today.  What matters to the patients health and wellness today?  Find cause of balance problems and manage blood sugar    Goals Addressed             This Visit's Progress    Improve Balance and Decrease Falls   Not on track    Care Coordination Goals: Patient will use cane for balance and practice walking with that Patient will move carefully and change positions slowly to decrease risk for falls Patient will report any falls to provider and will seek medical attention if necessary Patient will report any new or worsening symptoms to provider or seek appropriate medical attention Patient/wife will schedule appointment with ENT (requested referral from PCP) for mastoid effusion seen on MRI. This may have an effect on his balance.  Patient will reach out to RN Care Coordinator [phone] with any resource or care coordination needs     Manage Blood Pressure & Heart Rate   On track    Care Coordination Goals: Patient will monitor and record blood pressure and heart rate daily and as needed and will call PCP or specialist with any readings outside of recommended range Patient will keep all recommended follow-up appointments with PCP, specialists, and for imaging or testing Patient will take blood pressure log to PCP and specialty appointments for review Patient will reach out to RN Care Coordinator [phone] with any care coordination or resource needs      Manage Blood Sugar   On track    Care Coordination Goals: Patient will take medication as prescribed and reach out to provider with any negative side effects Discontinued jardiance. Maintained Metformin. Only takes insulin at meals if needed and he hasn't needed it over the past  couple of weeks.  Patient will monitor and record blood sugar 2 times daily and as needed with glucometer, and will call PCP with any readings outside of recommended range Patient will take blood sugar log and meter to provider visits for review Patient will eat meals at regular intervals and not skip meals 3 meals per day with 30 grams of carbohydrates and up to 2 snacks, if needed, with less than 15 grams of carbohyrates Patient will increase activity level as tolerated with an ultimate goal of at least 150 minutes of exercise per week Patient will reach out to RN Care Coordinator [phone] with any care coordination or resource needs      COMPLETED: Wife's Goal: Work on Memory   On track    Care Coordination Goals: Patient/wife will talk with PCP regarding any new or worsening symptoms Patient will take medications as prescribed Continue to use weekly pill box and only take medication on the appropriate day and time Patient will increase activity level as tolerated with a goal of at least 150 minutes per week Patient will increase social interactions if possible Patient/wife will reach out to RN Care Coordinator at [phone] with any resource or care coordination needs  Per wife, memory loss has stabilized.        SDOH assessments and interventions completed:  Yes SDOH Interventions Today    Flowsheet Row Most Recent Value  SDOH Interventions   Transportation Interventions Intervention Not Indicated  Financial Strain Interventions  Intervention Not Indicated      Care Coordination Interventions:  Yes, provided  Interventions Today    Flowsheet Row Most Recent Value  Chronic Disease   Chronic disease during today's visit Diabetes, Hypertension (HTN)  General Interventions   General Interventions Discussed/Reviewed General Interventions Discussed, General Interventions Reviewed, Labs, Durable Medical Equipment (DME), Doctor Visits  Labs Hgb A1c every 6 months  Doctor  Visits Discussed/Reviewed Doctor Visits Discussed, Doctor Visits Reviewed, PCP, Annual Wellness Visits, Specialist  Durable Medical Equipment (DME) Glucomoter, BP Cuff  [fasting glucose is averaging 100-110. No readings less than 70 or over 200. Blood pressure was 118/64]  PCP/Specialist Visits Compliance with follow-up visit  [Cardiology visit scheduled for 11/09/23. ENT referral appointment pending scheduling.]  Exercise Interventions   Exercise Discussed/Reviewed Exercise Discussed, Exercise Reviewed, Physical Activity, Assistive device use and maintanence  Physical Activity Discussed/Reviewed Physical Activity Discussed, Physical Activity Reviewed  [able to perform ADLs. Walks independently. Encouraged to use cane for balance. Does not exercise. Poor balance is a barrier.]  Education Interventions   Education Provided Provided Education  Provided Verbal Education On Nutrition, When to see the doctor, Exercise, Blood Sugar Monitoring, Labs, Medication, Other  [blood pressure monitoring]  Labs Reviewed Hgb A1c  [07/31/23 A1C 6.8%]  Nutrition Interventions   Nutrition Discussed/Reviewed Nutrition Discussed, Nutrition Reviewed, Carbohydrate meal planning, Portion sizes, Decreasing sugar intake, Increasing proteins, Adding fruits and vegetables, Fluid intake, Decreasing salt  [Per wife, appetite is better. Encourage to eat 3 meals per day with 30 GM of CHO and up to 2 snacks per day, if neded, with less than 15 GM of CHO.]  Pharmacy Interventions   Pharmacy Dicussed/Reviewed Pharmacy Topics Discussed, Pharmacy Topics Reviewed, Medications and their functions  [Has stopped jardiance and statin. Taking Metforming, magnesium 400mg , and b12 supplement. Takes insulin only if needed at mealtime for elevated blood sugar. Has not had to take it in at least the last couple of weeks.]  Safety Interventions   Safety Discussed/Reviewed Safety Reviewed, Fall Risk, Safety Discussed, Home Safety  [2 mechanical falls  since last telephone call. Has ENT referral to evaluate mastoid effusion as potential cause for problems with balance. Encouraged fall precautions and assistive devices.]  Home Safety Assistive Devices  Advanced Directive Interventions   Advanced Directives Discussed/Reviewed Advanced Directives Discussed, Advanced Care Planning  [encouraged to consider]       Follow up plan: Follow up call scheduled for 12/14/23    Encounter Outcome:  Patient Visit Completed   Demetrios Loll, RN, BSN Care Management Coordinator [facility]  [facility] Direct Dial: [phone] Main #: [phone]

## 2023-10-25 DIAGNOSIS — M79675 Pain in left toe(s): Secondary | ICD-10-CM | POA: Diagnosis not present

## 2023-10-25 DIAGNOSIS — I739 Peripheral vascular disease, unspecified: Secondary | ICD-10-CM | POA: Diagnosis not present

## 2023-10-25 DIAGNOSIS — I10 Essential (primary) hypertension: Secondary | ICD-10-CM | POA: Diagnosis not present

## 2023-10-25 DIAGNOSIS — Z299 Encounter for prophylactic measures, unspecified: Secondary | ICD-10-CM | POA: Diagnosis not present

## 2023-10-25 DIAGNOSIS — E1140 Type 2 diabetes mellitus with diabetic neuropathy, unspecified: Secondary | ICD-10-CM | POA: Diagnosis not present

## 2023-10-25 DIAGNOSIS — R609 Edema, unspecified: Secondary | ICD-10-CM | POA: Diagnosis not present

## 2023-11-06 DIAGNOSIS — Z299 Encounter for prophylactic measures, unspecified: Secondary | ICD-10-CM | POA: Diagnosis not present

## 2023-11-06 DIAGNOSIS — Z794 Long term (current) use of insulin: Secondary | ICD-10-CM | POA: Diagnosis not present

## 2023-11-06 DIAGNOSIS — I10 Essential (primary) hypertension: Secondary | ICD-10-CM | POA: Diagnosis not present

## 2023-11-06 DIAGNOSIS — E1140 Type 2 diabetes mellitus with diabetic neuropathy, unspecified: Secondary | ICD-10-CM | POA: Diagnosis not present

## 2023-11-06 DIAGNOSIS — E1169 Type 2 diabetes mellitus with other specified complication: Secondary | ICD-10-CM | POA: Diagnosis not present

## 2023-11-09 ENCOUNTER — Ambulatory Visit: Payer: Medicare PPO | Attending: Internal Medicine | Admitting: Internal Medicine

## 2023-11-09 ENCOUNTER — Telehealth: Payer: Self-pay | Admitting: Internal Medicine

## 2023-11-09 ENCOUNTER — Encounter: Payer: Self-pay | Admitting: Internal Medicine

## 2023-11-09 VITALS — BP 128/66 | HR 48 | Ht 74.0 in | Wt 166.0 lb

## 2023-11-09 DIAGNOSIS — I998 Other disorder of circulatory system: Secondary | ICD-10-CM | POA: Diagnosis not present

## 2023-11-09 DIAGNOSIS — I739 Peripheral vascular disease, unspecified: Secondary | ICD-10-CM

## 2023-11-09 DIAGNOSIS — I70222 Atherosclerosis of native arteries of extremities with rest pain, left leg: Secondary | ICD-10-CM | POA: Insufficient documentation

## 2023-11-09 DIAGNOSIS — I70223 Atherosclerosis of native arteries of extremities with rest pain, bilateral legs: Secondary | ICD-10-CM | POA: Diagnosis not present

## 2023-11-09 MED ORDER — ATORVASTATIN CALCIUM 40 MG PO TABS: 40.0000 mg | ORAL_TABLET | Freq: Every day | ORAL | 3 refills | Status: DC

## 2023-11-09 NOTE — Patient Instructions (Addendum)
Medication Instructions:  Your physician has recommended you make the following change in your medication:  Start taking Atorvastatin 40 mg daily Continue taking all other medications as prescribed  Labwork: None  Testing/Procedures: CT ANGIO AO+BIFEM W & OR WO CONTRAST   Follow-Up: Your physician recommends that you schedule a follow-up appointment in: 6 months  Any Other Special Instructions Will Be Listed Below (If Applicable). Referral to Vascular Surgery If you need a refill on your cardiac medications before your next appointment, please call your pharmacy.

## 2023-11-09 NOTE — Telephone Encounter (Signed)
Checking percert on the following patient for   URGENT REQUEST   CT ANGIO AO+BIFEM W/CM &/OR WO   [facility]

## 2023-11-09 NOTE — Progress Notes (Signed)
Cardiology Office Note  Date: 11/09/2023   ID: Blake Lawson, DOB 1935-01-23, MRN [id number]  PCP:  [name], MD  Cardiologist:  Marjo Bicker, MD Electrophysiologist:  Maurice Small, MD    History of Present Illness: Blake Lawson is a 88 y.o. 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  Event monitor from 05/2023 showed 90.7% PVC burden, 13 SVT and 28 NSVT runs.  Asymptomatic with no palpitations.  He has dizziness but mainly from sitting to standing position, orthostatic.  Currently on p.o. metoprolol succinate 25 mg once daily (previously on 50 mg dose, dose decreased due to severe sinus bradycardia).  Refused amiodarone due to side effects.  CT cardiac was ordered to rule out any ischemia due to significant PVC burden but never scheduled.  He denies having any symptoms of angina, DOE, orthopnea, PND or leg swelling.  He does have balance issues, has neuropathy of his legs and recently noticed discoloration/wound at the tip of his left great toe.  Initially it was thought to be infection, treated with antibiotics and someone got better. He reported having pain in the back of his legs especially when he walks and starts to have claudication symptoms when he walks 100 yards.  Ultrasound arterial lower extremity was performed in 2022 that showed very severe PAD in bilateral lower extremities.  Has not seen vascular recently.  Past Medical History:  Diagnosis Date   Arthritis    Asthma    Diabetes mellitus, type II (HCC)    DVT (deep venous thrombosis) (HCC)    Hyperlipidemia    Hypertension    Stroke (HCC) 05/2021   stroke in the right eye    Past Surgical History:  Procedure Laterality Date   APPLICATION OF CRANIAL NAVIGATION Right 09/10/2021   Procedure: APPLICATION OF CRANIAL NAVIGATION;  Surgeon: Lisbeth Renshaw, MD;   Location: [facility] OR;  Service: Neurosurgery;  Laterality: Right;   CHOLECYSTECTOMY     CRANIOTOMY Right 09/10/2021   Procedure: 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;  Surgeon: Lisbeth Renshaw, MD;  Location: [facility] OR;  Service: Neurosurgery;  Laterality: Right;   TRANSURETHRAL RESECTION OF PROSTATE      Current Outpatient Medications  Medication Sig Dispense Refill   albuterol (VENTOLIN HFA) 108 (90 Base) MCG/ACT inhaler Inhale 2 puffs into the lungs every 6 (six) hours as needed for wheezing or shortness of breath.     amLODipine (NORVASC) 5 MG tablet Take 5 mg by mouth daily.     apixaban (ELIQUIS) 5 MG TABS tablet Take 1 tablet (5 mg total) by mouth 2 (two) times daily. 60 tablet    clotrimazole (LOTRIMIN) 1 % cream Apply 1 application  topically daily.     latanoprost (XALATAN) 0.005 % ophthalmic solution Place 1 drop into both eyes at bedtime.     levocetirizine (XYZAL) 5 MG tablet 5 mg as needed.     losartan (COZAAR) 100 MG tablet Take 100 mg by mouth daily.     Magnesium 400 MG TABS Take 400 mg by mouth daily.     metFORMIN (GLUCOPHAGE) 500 MG tablet Take 1,000 mg by mouth 2 (two) times daily with a meal.     metoprolol succinate (TOPROL-XL) 50 MG 24 hr tablet Take 25 mg by mouth daily.     atorvastatin (LIPITOR) 40 MG tablet Take 40  mg by mouth daily. (Patient not taking: Reported on 11/09/2023)     empagliflozin (JARDIANCE) 25 MG TABS tablet Take 25 mg by mouth daily. (Patient not taking: Reported on 11/09/2023)     insulin NPH-regular Human (HUMULIN 70/30) (70-30) 100 UNIT/ML injection Inject 25-35 Units into the skin 2 (two) times daily with a meal. Per sliding Scale (Patient not taking: Reported on 11/09/2023)     No current facility-administered medications for this visit.   Allergies:  Patient has no known allergies.   Social History: The patient  reports that he has quit smoking. His smoking use included cigarettes. He has never used smokeless tobacco.  He reports current alcohol use. He reports that he does not use drugs.   Family History: The patient's family history includes Diabetes in his father and mother; Hypertension in his mother.   ROS:  Please see the history of present illness. Otherwise, complete review of systems is positive for none  All other systems are reviewed and negative.   Physical Exam: VS:  BP 128/66 (BP Location: Left Arm, Patient Position: Sitting, Cuff Size: Normal)   Pulse (!) 48   Ht 6\' 2"  (1.88 m)   Wt 166 lb (75.3 kg)   SpO2 98%   BMI 21.31 kg/m , BMI Body mass index is 21.31 kg/m.  Wt Readings from Last 3 Encounters:  11/09/23 166 lb (75.3 kg)  09/01/23 165 lb 6.4 oz (75 kg)  07/26/23 163 lb 3.2 oz (74 kg)    General: Patient appears comfortable at rest. HEENT: Conjunctiva and lids normal, oropharynx clear with moist mucosa. Neck: Supple, no elevated JVP or carotid bruits, no thyromegaly. Lungs: Clear to auscultation, nonlabored breathing at rest. Cardiac: Regular rate and rhythm, no S3 or significant systolic murmur, no pericardial rub. Abdomen: Soft, nontender, no hepatomegaly, bowel sounds present, no guarding or rebound. Extremities: No pitting edema, L DP non-dopplerable, R DP faint doppler signal, R PT and L PT dopplerable with good signal. Numbness in left foot. Skin: Warm and dry. Musculoskeletal: No kyphosis. Neuropsychiatric: Alert and oriented x3, affect grossly appropriate.  Recent Labwork: No results found for requested labs within last 365 days.  No results found for: "CHOL", "TRIG", "HDL", "CHOLHDL", "VLDL", "LDLCALC", "LDLDIRECT"  Other Studies Reviewed Today: Addendum on 05/09/2023 Echocardiogram on 03/2023 LVEF 50 to 55% with RWMA (basal anterior, basal anterolateral, basal inferior, basal inferoseptal, mid anterior, mid anterolateral, mid inferior and mid inferoseptal hypokinesis) Left atrium mildly dilated Right atrium normal in size RV normal in size Mild MR Mild to  moderate PR  Assessment and Plan:  PVC burden 19.7%, asymptomatic (LVEF 50 to 55% with RWMA) Critical limb ischemia of RLE HTN, controlled HLD, unknown values   -No interval angina, DOE or palpitations. Dizziness is positional and not cardiac in origin.  Will continue metoprolol succinate 25 mg once daily for management of significant/frequent PVCs.  CT cardiac was ordered in the past to rule out ischemia, will defer now due to no symptoms. -He reported having claudication in bilateral lower extremities and cannot walk more than 100 yards.  Discoloration/wound noticed at the tip of his left great toe. L DP non-dopplerable, R DP faint doppler signal, R PT and L PT dopplerable, good signal. Will obtain CTA pelvis and lower extremities runoff and place referral to vascular surgery.  Not on aspirin due to Eliquis use, continue Eliquis (has been on AC x 20 years for DVT), resume atorvastatin 40 mg nightly.  Continue losartan 100 mg once daily and  amlodipine 5 mg once daily.   I have spent a total of 40 minutes reviewing prior notes, discussed about PVC management and mainly CLI including evaluation and management.  Documenting the findings note.  Medication Adjustments/Labs and Tests Ordered: Current medicines are reviewed at length with the patient today.  Concerns regarding medicines are outlined above.    Disposition:  Follow up 6 months  Signed Zaidan Keeble Verne Spurr, MD, 11/09/2023 1:12 PM    [facility] [address] [place], [place], [place] [address]

## 2023-11-10 ENCOUNTER — Ambulatory Visit (HOSPITAL_COMMUNITY)
Admission: RE | Admit: 2023-11-10 | Discharge: 2023-11-10 | Disposition: A | Discharge status: Home / Self Care | Payer: Medicare PPO | Source: Ambulatory Visit | Attending: Internal Medicine

## 2023-11-10 DIAGNOSIS — I701 Atherosclerosis of renal artery: Secondary | ICD-10-CM | POA: Diagnosis not present

## 2023-11-10 DIAGNOSIS — I739 Peripheral vascular disease, unspecified: Secondary | ICD-10-CM | POA: Insufficient documentation

## 2023-11-10 DIAGNOSIS — Q600 Renal agenesis, unilateral: Secondary | ICD-10-CM | POA: Diagnosis not present

## 2023-11-10 DIAGNOSIS — I70219 Atherosclerosis of native arteries of extremities with intermittent claudication, unspecified extremity: Secondary | ICD-10-CM | POA: Diagnosis not present

## 2023-11-10 DIAGNOSIS — N281 Cyst of kidney, acquired: Secondary | ICD-10-CM | POA: Diagnosis not present

## 2023-11-10 LAB — POCT I-STAT CREATININE: Creatinine, Ser: 0.8 mg/dL (ref 0.61–1.24)

## 2023-11-10 MED ORDER — IOHEXOL 350 MG/ML SOLN
100.0000 mL | Freq: Once | INTRAVENOUS | Status: AC | PRN
  Administered 2023-11-10: 100 mL via INTRAVENOUS

## 2023-11-13 NOTE — Progress Notes (Unsigned)
[facility] [place]  ASSESSMENT / PLAN: Blake Lawson is a 88 y.o. male with atherosclerosis of native arteries of left lower extremity causing ulceration.  Recommend:  Abstinence from all tobacco products. Blood glucose control with goal A1c < 7%. Blood pressure control with goal blood pressure < 140/90 mmHg. Lipid reduction therapy with goal LDL-C <100 mg/dL  Eliquis 5mg  PO BID Atorvastatin 40-80mg  PO QD (or other "high intensity" statin therapy).  Left great toe ulcer appears fairly benign.  Toe pressures are reassuring.  I suspect he will heal without intervention.  I will see him again in 1 month to check the foot  CHIEF COMPLAINT: left great toe ulcer  HISTORY OF PRESENT ILLNESS: Blake Lawson is a 88 y.o. male for to clinic for evaluation of left great toe ulcer.  The patient is followed by Dr. Jenene Slicker who noted a ulcer on his left great toe earlier this month.  A CT angiogram was ordered to evaluate lower extremity arteries.  This showed multiple issues (see below).  Patient was referred for further management.  On my evaluation, the patient is a very pleasant elderly gentleman who is minimally ambulatory.  He is with his daughter today.  The patient has been having trouble with ambulating, and has suffered frequent falls.  The ulcer on his left great toe developed during a period of significant swelling in his lower extremities.  This is much improved with starting Lasix and using compression garments.  Ulcer has been present for about 3 weeks.  Patient does not walk enough to claudicate.  He does not describe rest pain.  VASCULAR SURGICAL HISTORY: none  VASCULAR RISK FACTORS: Positive history of stroke / transient ischemic attack. Negative history of coronary artery disease.  Positive history of diabetes mellitus. Positive history of smoking. Not actively smoking. Positive history of hypertension.  Negative history of chronic kidney disease.   Negative history of chronic obstructive pulmonary disease.  FUNCTIONAL STATUS: ECOG performance status: (2) Ambulatory and capable of self care, unable to carry out work activity, up and about > 50% or waking hours Ambulatory status: Minimally ambulatory (e.g. 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    Past Surgical History:  Procedure Laterality Date   APPLICATION OF CRANIAL NAVIGATION Right 09/10/2021   Procedure: APPLICATION OF CRANIAL NAVIGATION;  Surgeon: Lisbeth Renshaw, MD;  Location: [facility] OR;  Service: Neurosurgery;  Laterality: Right;   CHOLECYSTECTOMY     CRANIOTOMY Right 09/10/2021   Procedure: 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;  Surgeon: Lisbeth Renshaw, MD;  Location: [facility] OR;  Service: Neurosurgery;  Laterality: Right;   TRANSURETHRAL RESECTION OF PROSTATE      Family History  Problem Relation Age of Onset   Hypertension Mother    Diabetes Mother    Diabetes  Father     Social History   Socioeconomic History   Marital status: Married    Spouse name: Not on file   Number of children: Not on file   Years of education: Not on file   Highest education level: Not on file  Occupational History   Not on file  Tobacco Use   Smoking status: Former    Types: Cigarettes   Smokeless tobacco: Never  Vaping Use   Vaping status: Never Used  Substance and  Sexual Activity   Alcohol use: Yes    Comment: occasional   Drug use: Never   Sexual activity: Not on file  Other Topics Concern   Not on file  Social History Narrative   Not on file   Social Determinants of Health   Financial Resource Strain: Low Risk  (10/18/2023)   Overall Financial Resource Strain (CARDIA)    Difficulty of Paying Living Expenses: Not very hard  Food Insecurity: No Food Insecurity (03/08/2023)   Hunger Vital Sign    Worried About Running Out of Food in the Last Year: Never true    Ran Out of Food in the Last Year: Never true  Transportation Needs: No Transportation Needs (10/18/2023)   PRAPARE - Administrator, Civil Service (Medical): No    Lack of Transportation (Non-Medical): No  Physical Activity: Insufficiently Active (01/02/2023)   Exercise Vital Sign    Days of Exercise per Week: 2 days    Minutes of Exercise per Session: 40 min  Stress: Not on file  Social Connections: Not on file  Intimate Partner Violence: Not At Risk (03/08/2023)   Humiliation, Afraid, Rape, and Kick questionnaire    Fear of Current or Ex-Partner: No    Emotionally Abused: No    Physically Abused: No    Sexually Abused: No    No Known Allergies  Current Outpatient Medications  Medication Sig Dispense Refill   albuterol (VENTOLIN HFA) 108 (90 Base) MCG/ACT inhaler Inhale 2 puffs into the lungs every 6 (six) hours as needed for wheezing or shortness of breath.     amLODipine (NORVASC) 5 MG tablet Take 5 mg by mouth daily.     apixaban (ELIQUIS) 5 MG TABS tablet Take 1 tablet (5 mg total) by mouth 2 (two) times daily. 60 tablet    atorvastatin (LIPITOR) 40 MG tablet Take 1 tablet (40 mg total) by mouth daily. 30 tablet 3   clotrimazole (LOTRIMIN) 1 % cream Apply 1 application  topically daily.     insulin NPH-regular Human (HUMULIN 70/30) (70-30) 100 UNIT/ML injection Inject 25-35 Units into the skin 2 (two) times daily with a meal. Per sliding Scale     latanoprost  (XALATAN) 0.005 % ophthalmic solution Place 1 drop into both eyes at bedtime.     levocetirizine (XYZAL) 5 MG tablet 5 mg as needed.     losartan (COZAAR) 100 MG tablet Take 100 mg by mouth daily.     Magnesium 400 MG TABS Take 400 mg by mouth daily.     metFORMIN (GLUCOPHAGE) 500 MG tablet Take 1,000 mg by mouth 2 (two) times daily with a meal.     metoprolol succinate (TOPROL-XL) 50 MG 24 hr tablet Take 25 mg by mouth daily.     empagliflozin (JARDIANCE) 25 MG TABS tablet Take 25 mg by mouth daily. (Patient not taking: Reported on 11/14/2023)     No current facility-administered medications for this visit.    PHYSICAL EXAM Vitals:  11/14/23 0919  BP: 116/74  Pulse: 66  Temp: (!) 97.5 F (36.4 C)  TempSrc: Oral  SpO2: 97%  Weight: 166 lb (75.3 kg)  Height: 6\' 2"  (1.88 m)   No distress Regular rate and rhythm Unlabored breathing No palpable pedal pulses Left great toe ulceration which is healing.  There is no active break in the skin.  There is a mild eschar    PERTINENT LABORATORY AND RADIOLOGIC DATA  Most recent CBC    Latest Ref Rng & Units 09/08/2021   10:36 AM  CBC  WBC 4.0 - 10.5 K/uL 6.3   Hemoglobin 13.0 - 17.0 g/dL 96.2   Hematocrit 95.2 - 52.0 % 45.9   Platelets 150 - 400 K/uL 195      Most recent CMP    Latest Ref Rng & Units 11/10/2023   10:07 AM 09/08/2021   10:36 AM  CMP  Glucose 70 - 99 mg/dL  841   BUN 8 - 23 mg/dL  18   Creatinine 3.24 - 1.24 mg/dL 4.01  0.27   Sodium 253 - 145 mmol/L  141   Potassium 3.5 - 5.1 mmol/L  4.0   Chloride 98 - 111 mmol/L  107   CO2 22 - 32 mmol/L  27   Calcium 8.9 - 10.3 mg/dL  9.4     Renal function Estimated Creatinine Clearance: 70.6 mL/min (by C-G formula based on SCr of 0.8 mg/dL).  Hemoglobin A1C (no units)  Date Value  04/14/2023 7.5   CT angiogram of bilateral lower extremities. No formal read available yet. On my evaluation, there is multifocal disease on the left. The left distal common iliac and  proximal external iliac have severe stenosis. There is a "flush" appearing occlusion of the left SFA. The popliteal artery reconstitutes behind the knee but is heavily diseased. The trifurcation appears patent proximally. Distally there is insufficient contrast to evaluate the distal tibial / pedal circulation.    Rande Brunt. Lenell Antu, MD FACS [facility] Phone Number: [phone] 11/14/2023 10:33 AM   Total time spent on preparing this encounter including chart review, data review, collecting history, examining the patient, coordinating care for this new patient, 60 minutes.  Portions of this report may have been transcribed using voice recognition software.  Every effort has been made to ensure accuracy; however, inadvertent computerized transcription errors may still be present.

## 2023-11-14 ENCOUNTER — Other Ambulatory Visit (INDEPENDENT_AMBULATORY_CARE_PROVIDER_SITE_OTHER): Payer: Medicare PPO

## 2023-11-14 ENCOUNTER — Ambulatory Visit (INDEPENDENT_AMBULATORY_CARE_PROVIDER_SITE_OTHER): Payer: Medicare PPO | Admitting: Vascular Surgery

## 2023-11-14 ENCOUNTER — Other Ambulatory Visit (HOSPITAL_COMMUNITY): Payer: Self-pay | Admitting: Vascular Surgery

## 2023-11-14 ENCOUNTER — Encounter: Payer: Self-pay | Admitting: Vascular Surgery

## 2023-11-14 VITALS — BP 116/74 | HR 66 | Temp 97.5°F | Ht 74.0 in | Wt 166.0 lb

## 2023-11-14 DIAGNOSIS — I739 Peripheral vascular disease, unspecified: Secondary | ICD-10-CM

## 2023-11-14 DIAGNOSIS — I70245 Atherosclerosis of native arteries of left leg with ulceration of other part of foot: Secondary | ICD-10-CM | POA: Diagnosis not present

## 2023-11-14 LAB — VAS US ABI WITH/WO TBI
Left ABI: 0.6
Right ABI: 0.72

## 2023-11-17 DIAGNOSIS — I700 Atherosclerosis of aorta: Secondary | ICD-10-CM | POA: Diagnosis not present

## 2023-11-17 DIAGNOSIS — I10 Essential (primary) hypertension: Secondary | ICD-10-CM | POA: Diagnosis not present

## 2023-11-17 DIAGNOSIS — I739 Peripheral vascular disease, unspecified: Secondary | ICD-10-CM | POA: Diagnosis not present

## 2023-11-17 DIAGNOSIS — K5900 Constipation, unspecified: Secondary | ICD-10-CM | POA: Diagnosis not present

## 2023-11-17 DIAGNOSIS — Z299 Encounter for prophylactic measures, unspecified: Secondary | ICD-10-CM | POA: Diagnosis not present

## 2023-11-17 DIAGNOSIS — J984 Other disorders of lung: Secondary | ICD-10-CM | POA: Diagnosis not present

## 2023-11-17 DIAGNOSIS — R0781 Pleurodynia: Secondary | ICD-10-CM | POA: Diagnosis not present

## 2023-11-21 DIAGNOSIS — R42 Dizziness and giddiness: Secondary | ICD-10-CM | POA: Diagnosis not present

## 2023-11-21 DIAGNOSIS — Z299 Encounter for prophylactic measures, unspecified: Secondary | ICD-10-CM | POA: Diagnosis not present

## 2023-11-21 DIAGNOSIS — I10 Essential (primary) hypertension: Secondary | ICD-10-CM | POA: Diagnosis not present

## 2023-11-21 DIAGNOSIS — M549 Dorsalgia, unspecified: Secondary | ICD-10-CM | POA: Diagnosis not present

## 2023-11-21 DIAGNOSIS — E1169 Type 2 diabetes mellitus with other specified complication: Secondary | ICD-10-CM | POA: Diagnosis not present

## 2023-11-21 DIAGNOSIS — M1710 Unilateral primary osteoarthritis, unspecified knee: Secondary | ICD-10-CM | POA: Diagnosis not present

## 2023-12-11 NOTE — Progress Notes (Signed)
[facility] [place]  ASSESSMENT / PLAN: Blake Lawson is a 88 y.o. male with atherosclerosis of native arteries of left lower extremity causing ulceration.  Recommend:  Abstinence from all tobacco products. Blood glucose control with goal A1c < 7%. Blood pressure control with goal blood pressure < 140/90 mmHg. Lipid reduction therapy with goal LDL-C <100 mg/dL  Eliquis 5mg  PO BID Atorvastatin 40-80mg  PO QD (or other "high intensity" statin therapy).  Left great toe ulcer appears fairly benign.  Toe pressures are reassuring.  I suspect he will heal without intervention.  I will see him again in 2 months to check the foot  CHIEF COMPLAINT: left great toe ulcer  HISTORY OF PRESENT ILLNESS: Blake Lawson is a 88 y.o. male for to clinic for evaluation of left great toe ulcer.  The patient is followed by Dr. Jenene Slicker who noted a ulcer on his left great toe earlier this month.  A CT angiogram was ordered to evaluate lower extremity arteries.  This showed multiple issues (see below).  Patient was referred for further management.  On my evaluation, the patient is a very pleasant elderly gentleman who is minimally ambulatory.  He is with his daughter today.  The patient has been having trouble with ambulating, and has suffered frequent falls.  The ulcer on his left great toe developed during a period of significant swelling in his lower extremities.  This is much improved with starting Lasix and using compression garments.  Ulcer has been present for about 3 weeks.  Patient does not walk enough to claudicate.  He does not describe rest pain.  12/12/23: patient returns with his daughter. Doing well. He feels the wound is healing slowly.   VASCULAR SURGICAL HISTORY: none  VASCULAR RISK FACTORS: Positive history of stroke / transient ischemic attack. Negative history of coronary artery disease.  Positive history of diabetes mellitus. Positive history of smoking. Not  actively smoking. Positive history of hypertension.  Negative history of chronic kidney disease.  Negative history of chronic obstructive pulmonary disease.  FUNCTIONAL STATUS: ECOG performance status: (2) Ambulatory and capable of self care, unable to carry out work activity, up and about > 50% or waking hours Ambulatory status: Minimally ambulatory (e.g. 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    Past Surgical History:  Procedure Laterality Date   APPLICATION OF CRANIAL NAVIGATION Right 09/10/2021   Procedure: APPLICATION OF CRANIAL NAVIGATION;  Surgeon: Lisbeth Renshaw, MD;  Location: [facility] OR;  Service: Neurosurgery;  Laterality: Right;   CHOLECYSTECTOMY     CRANIOTOMY Right 09/10/2021   Procedure: 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;  Surgeon: Lisbeth Renshaw, MD;  Location: [facility] OR;  Service: Neurosurgery;  Laterality: Right;   TRANSURETHRAL RESECTION OF PROSTATE      Family History  Problem  Relation Age of Onset   Hypertension Mother    Diabetes Mother    Diabetes Father     Social History   Socioeconomic History   Marital status: Married    Spouse name: Not on file   Number of children: Not on file   Years of education: Not on file   Highest education level: Not on file  Occupational History   Not on file  Tobacco Use   Smoking status: Former    Types:  Cigarettes   Smokeless tobacco: Never  Vaping Use   Vaping status: Never Used  Substance and Sexual Activity   Alcohol use: Yes    Comment: occasional   Drug use: Never   Sexual activity: Not on file  Other Topics Concern   Not on file  Social History Narrative   Not on file   Social Drivers of Health   Financial Resource Strain: Low Risk  (10/18/2023)   Overall Financial Resource Strain (CARDIA)    Difficulty of Paying Living Expenses: Not very hard  Food Insecurity: No Food Insecurity (03/08/2023)   Hunger Vital Sign    Worried About Running Out of Food in the Last Year: Never true    Ran Out of Food in the Last Year: Never true  Transportation Needs: No Transportation Needs (10/18/2023)   PRAPARE - Administrator, Civil Service (Medical): No    Lack of Transportation (Non-Medical): No  Physical Activity: Insufficiently Active (01/02/2023)   Exercise Vital Sign    Days of Exercise per Week: 2 days    Minutes of Exercise per Session: 40 min  Stress: Not on file  Social Connections: Not on file  Intimate Partner Violence: Not At Risk (03/08/2023)   Humiliation, Afraid, Rape, and Kick questionnaire    Fear of Current or Ex-Partner: No    Emotionally Abused: No    Physically Abused: No    Sexually Abused: No    Allergies  Allergen Reactions   Hydrocodone Other (See Comments)    Current Outpatient Medications  Medication Sig Dispense Refill   albuterol (VENTOLIN HFA) 108 (90 Base) MCG/ACT inhaler Inhale 2 puffs into the lungs every 6 (six) hours as needed for wheezing or shortness of breath.     amLODipine (NORVASC) 5 MG tablet Take 5 mg by mouth daily.     apixaban (ELIQUIS) 5 MG TABS tablet Take 1 tablet (5 mg total) by mouth 2 (two) times daily. 60 tablet    atorvastatin (LIPITOR) 40 MG tablet Take 1 tablet (40 mg total) by mouth daily. 30 tablet 3   BD VEO INSULIN SYRINGE U/F 31G X 15/64" 1 ML MISC 2 (two) times daily.     clotrimazole (LOTRIMIN) 1 % cream  Apply 1 application  topically daily.     empagliflozin (JARDIANCE) 25 MG TABS tablet Take 25 mg by mouth daily.     fluticasone (FLONASE) 50 MCG/ACT nasal spray Place 1 spray into both nostrils daily.     furosemide (LASIX) 20 MG tablet Take 20 mg by mouth daily.     insulin NPH-regular Human (HUMULIN 70/30) (70-30) 100 UNIT/ML injection Inject 25-35 Units into the skin 2 (two) times daily with a meal. Per sliding Scale     latanoprost (XALATAN) 0.005 % ophthalmic solution Place 1 drop into both eyes at bedtime.     levocetirizine (XYZAL) 5 MG tablet 5 mg as needed.     losartan (COZAAR) 100 MG tablet Take 100 mg by mouth daily.  Magnesium 400 MG TABS Take 400 mg by mouth daily.     metFORMIN (GLUCOPHAGE) 500 MG tablet Take 1,000 mg by mouth 2 (two) times daily with a meal.     metoprolol succinate (TOPROL-XL) 50 MG 24 hr tablet Take 25 mg by mouth daily.     No current facility-administered medications for this visit.    PHYSICAL EXAM Vitals:   12/12/23 1053  BP: 138/73  Pulse: 78  SpO2: 100%  Weight: 168 lb 12.8 oz (76.6 kg)  Height: 6\' 2"  (1.88 m)    No distress Regular rate and rhythm Unlabored breathing No palpable pedal pulses Left great toe ulceration appears improved. There ulcer measures about the size of a pencil eraser.    PERTINENT LABORATORY AND RADIOLOGIC DATA  Most recent CBC    Latest Ref Rng & Units 09/08/2021   10:36 AM  CBC  WBC 4.0 - 10.5 K/uL 6.3   Hemoglobin 13.0 - 17.0 g/dL 78.2   Hematocrit 95.6 - 52.0 % 45.9   Platelets 150 - 400 K/uL 195      Most recent CMP    Latest Ref Rng & Units 11/10/2023   10:07 AM 09/08/2021   10:36 AM  CMP  Glucose 70 - 99 mg/dL  213   BUN 8 - 23 mg/dL  18   Creatinine 0.86 - 1.24 mg/dL 5.78  4.69   Sodium 629 - 145 mmol/L  141   Potassium 3.5 - 5.1 mmol/L  4.0   Chloride 98 - 111 mmol/L  107   CO2 22 - 32 mmol/L  27   Calcium 8.9 - 10.3 mg/dL  9.4     Renal function CrCl cannot be calculated  (Patient's most recent lab result is older than the maximum 21 days allowed.).  Hemoglobin A1C (no units)  Date Value  04/14/2023 7.5   CT angiogram of bilateral lower extremities. No formal read available yet. On my evaluation, there is multifocal disease on the left. The left distal common iliac and proximal external iliac have severe stenosis. There is a "flush" appearing occlusion of the left SFA. The popliteal artery reconstitutes behind the knee but is heavily diseased. The trifurcation appears patent proximally. Distally there is insufficient contrast to evaluate the distal tibial / pedal circulation.    Rande Brunt. Lenell Antu, MD [facility] Phone Number: [phone] 12/12/2023 12:47 PM   Total time spent on preparing this encounter including chart review, data review, collecting history, examining the patient, coordinating care for this established patient, 20 minutes.   Portions of this report may have been transcribed using voice recognition software.  Every effort has been made to ensure accuracy; however, inadvertent computerized transcription errors may still be present.

## 2023-12-12 ENCOUNTER — Encounter: Payer: Self-pay | Admitting: Vascular Surgery

## 2023-12-12 ENCOUNTER — Ambulatory Visit: Payer: Medicare PPO | Admitting: Vascular Surgery

## 2023-12-12 VITALS — BP 138/73 | HR 78 | Ht 74.0 in | Wt 168.8 lb

## 2023-12-12 DIAGNOSIS — I739 Peripheral vascular disease, unspecified: Secondary | ICD-10-CM

## 2023-12-14 ENCOUNTER — Encounter: Payer: Self-pay | Admitting: *Deleted

## 2023-12-14 ENCOUNTER — Ambulatory Visit: Payer: Self-pay | Admitting: *Deleted

## 2023-12-14 NOTE — Patient Outreach (Signed)
Care Coordination   Follow Up Visit Note   12/14/2023 Name: HAIG SELA MRN: [id number] DOB: 03/08/1935  MIKIAH SIRKO is a 88 y.o. year old male who sees [name], MD for primary care. I  spoke with wife and caregiver, [name], by telephone today.  What matters to the patients health and wellness today?  Managing dementia and finding out if there are additional causes for his current mental state.    Goals Addressed             This Visit's Progress    Manage Blood Sugar   On track    Care Coordination Goals: Patient will take medication as prescribed and reach out to provider with any negative side effects Metformin decreased to 1 tablet twice a day. Insulin hasn't been needed because of good blood sugar control. Patient will monitor and record blood sugar 2 times daily and as needed with glucometer, and will call PCP with any readings outside of recommended range Patient will take blood sugar log and meter to provider visits for review Patient will eat meals at regular intervals and not skip meals 3 meals per day with 30 grams of carbohydrates and up to 2 snacks, if needed, with less than 15 grams of carbohyrates Patient will increase activity level as tolerated with an ultimate goal of at least 150 minutes of exercise per week Patient will reach out to RN Care Coordinator [phone] with any care coordination or resource needs      Manage Dementia and Behavioral Disturbance (Wife's Goal)       Care Management Needs: Patient will take medication as prescribed and will allow wife to assist with medication management Patient will keep follow-up appointment with PCP on 12/22/23 to discuss cognition and behavioral changes Wife will keep herself safe and will call 911 if patient becomes violent or is a threat to himself or to others Wife will talk with LCSW Re: level of care concerns Wife will remain in contact with patient's children regarding his status and will reach  out if she needs assistance with him Patient will avoid alcohol Patent will limit driving and avoid if possible Patient will consider staying with his daughter for a few days (daughter offered to let him stay) Patient/wife will reach out to RN Care Manager at [phone] with any care management needs       SDOH assessments and interventions completed:  Yes SDOH Interventions Today    Flowsheet Row Most Recent Value  SDOH Interventions   Transportation Interventions Intervention Not Indicated       Care Coordination Interventions:  Yes, provided  Interventions Today    Flowsheet Row Most Recent Value  Chronic Disease   Chronic disease during today's visit Diabetes, Other  [Possible dementia: Confusion and Behavioral Disturbance]  General Interventions   General Interventions Discussed/Reviewed General Interventions Discussed, General Interventions Reviewed, Labs, Doctor Visits, Communication with, Durable Medical Equipment (DME)  [History of alcoholism and wife thinks he may be drinking at times now. Has had episodes of "rage" & stays up for days at a time when in this state. Has accused her of untrue things. Worse at night. Does not act innapropriately around other people.]  Labs Hgb A1c every 3 months  Doctor Visits Discussed/Reviewed Doctor Visits Discussed, Doctor Visits Reviewed, PCP, Specialist  Durable Medical Equipment (DME) Glucomoter  [highest blood sugar has been 135. Averaging around 112.]  PCP/Specialist Visits Compliance with follow-up visit  [Follow-up with PCP as planned on 12/27 and  discuss concerns Re: cognition and behaivoral disturbance. Follow-up sooner if necessary. Dr Sherryll Burger plans to keep patient's son, who is also a doctor, in the loop. He lives in [place].]  Communication with Social Work  [employer] message to [employer], LCSW Re: behavioral disturbace and level of care concerns]  Exercise Interventions   Exercise Discussed/Reviewed Physical Activity   Physical Activity Discussed/Reviewed Physical Activity Discussed, Physical Activity Reviewed  [ambulates without assistance. Still drives to church once a month and is driving locally. Cautioned against this but wife doesn't have control over him.]  Education Interventions   Education Provided Provided Education  [call 911 if patient gets into a "rage" again or if he is a threat to himself or others. Daughter offered for him to stay with her for a few days, encouraged to consider this.]  Provided Verbal Education On Nutrition, When to see the doctor, Blood Sugar Monitoring, Labs, Foot Care, Mental Health/Coping with Illness, Medication, Exercise  Labs Reviewed --  [wife reports A1C was 5.1%]  Mental Health Interventions   Mental Health Discussed/Reviewed Mental Health Discussed, Mental Health Reviewed, Other, Refer to Social Work for counseling, Refer to Social Work for resources  Refer to Social Work for counseling regarding Other  [confusion, cognitive decline, behavioral disturbance, possible increaesd alcohol use. Wife has found bottles of wine and cans of beer and thinks he may have liquour as well.]  Refer to Social Work for resources regarding Other  [memory care or behavioral health facility? Needs evaluation]  Nutrition Interventions   Nutrition Discussed/Reviewed Nutrition Discussed, Nutrition Reviewed, Carbohydrate meal planning  [appetite has improved since last call]  Pharmacy Interventions   Pharmacy Dicussed/Reviewed Pharmacy Topics Discussed, Pharmacy Topics Reviewed, Medication Adherence, Medications and their functions  Medication Adherence --  [wife normally prepares meds in a pillbox. Pt. prepared his own earier this week and doubled up on blood pressure meds, beta blocker, and added a muscle relaxer that is only PRN and he doesn't usually take. Wife checked and corrected medications.]  Safety Interventions   Safety Discussed/Reviewed Safety Discussed, Safety Reviewed, Home  Safety, Fall Risk  [guns have been removed from the home. Some knives have been hidden. Wife is able to lock herself in her room if necessary. Patient is verbally agressive but has not been physically agressive. Wife will call 911 if necesary. No threat during time of call.]  Home Safety Assistive Devices       Follow up plan: Follow up call scheduled for 12/28/23    Encounter Outcome:  Patient Visit Completed   Demetrios Loll, RN, BSN Care Manager [place]  [facility]  Population Health  Direct Dial: [phone] Main #: [phone]

## 2023-12-18 ENCOUNTER — Ambulatory Visit: Payer: Self-pay | Admitting: *Deleted

## 2023-12-18 ENCOUNTER — Encounter: Payer: Self-pay | Admitting: *Deleted

## 2023-12-18 NOTE — Patient Instructions (Signed)
Visit Information  Thank you for taking time to visit with me today. Please don't hesitate to contact me if I can be of assistance to you.   Following are the goals we discussed today:   Goals Addressed             This Visit's Progress    Provide Engineer, technical sales, Support, Agencies & Resources.   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, Caregiver Burnout, Stress & Fatigue, Requires Assistance with Activities of Daily Living.]  General Interventions   General Interventions Discussed/Reviewed General Interventions Discussed, Labs, Vaccines, Doctor Visits, General Interventions Reviewed, Annual Eye Exam, Level of Care, [employer], Horticulturist, commercial (DME), Lipid Profile, Annual Foot Exam, Health Screening, Communication with  [Encouraged Routine Engagement with Care Team Members.]  Labs Hgb A1c every 3 months, Hgb A1c annually  [Encouraged Routine Blood Sugar Monitoring.]  Vaccines COVID-19, Flu, Pneumonia, RSV, Shingles, Tetanus/Pertussis/Diphtheria  [Encouraged Annual Vaccinations.]  Doctor Visits Discussed/Reviewed Doctor Visits Discussed, Specialist, Doctor Visits Reviewed, Annual Wellness Visits, PCP  [Encouraged Routine Engagement with Care Team Members.]  Health Screening Bone Density, Colonoscopy, Prostate  [Encouraged Annual Health Screenings.]  Durable Medical Equipment (DME) Glucomoter, Other, BP Cuff, Environmental consultant, Wheelchair  [Blood Pressure Cuff, [place], Scales, Prescription Eyeglasses, Engineer, materials in Air traffic controller, Psychologist, prison and probation services Hose.]  Wheelchair Standard  PCP/Specialist Visits Compliance with follow-up visit  Communication with PCP/Specialists, Charity fundraiser, Pharmacists, Social  Work  [employer] Routine Engagement with Care Team Members.]  Level of Care Adult Daycare, Air traffic controller, Assisted Living, Skilled Nursing Facility  [Confirmed Disinterest in Enrollment in Adult Day Care Program or Receiving Assistance Pursuing Higher Level of Care Placement Options (I.e Assisted Living Versus Skilled Nursing Facility).]  Applications Medicaid, Personal Care Services  [Confirmed Interest in Applying for Medicaid & Personal Care Services, Mailing Applications & Offering Assistance with Completion & Submission.]  Exercise Interventions   Exercise Discussed/Reviewed Exercise Discussed, Assistive device use and maintanence, Exercise Reviewed, Physical Activity, Weight Managment  [Encouraged Increased Level of Activity & Exercise, Inside & Outside the Home.]  Physical Activity Discussed/Reviewed Physical Activity Discussed, Home Exercise Program (HEP), PREP, Physical Activity Reviewed, Gym, Types of exercise  [Encouraged Daily Exercise Regimen, as Tolerated.]  Weight Management Weight loss  [Encouraged Healthy Weight Loss Program.]  Education Interventions   Education Provided Provided Therapist, sports, Provided Web-based Education, Provided Scientist, research (medical), Human resources officer Reviewed to Entertain Questions & Ensure Understanding.]  Provided Verbal Education On Nutrition, Foot Care, Eye Care, Labs, Blood Sugar Monitoring, Mental Health/Coping with Illness, When to see the doctor, Applications, Exercise, Medication, Development worker, community, [employer]  [employer] Consideration & Implementation of Educational Material.]  Labs Reviewed Hgb A1c  [Reviewed with Wife, Engineer, materials, Personal Care Services  [employer] Interest in Applying for [employer] & Personal Care Services, Teaching laboratory technician & Offering Assistance with Completion & Submission.]  Mental Health Interventions   Mental Health Discussed/Reviewed Mental Health Discussed, Anxiety,  Depression, Mental Health Reviewed, Grief and Loss, Substance Abuse, Coping Strategies, Suicide, Crisis, Other  [Assessed Mental Health & Cognitive Status.]  Nutrition Interventions   Nutrition Discussed/Reviewed Nutrition Discussed, Adding fruits and vegetables, Increasing proteins, Decreasing fats, Nutrition Reviewed, Fluid intake, Decreasing salt, Portion sizes, Carbohydrate meal planning, Decreasing sugar intake  [  Encouraged Heart-Healthy, Diabetic-Friendly, Low Sodium, Reduced Fat Diet.]  Pharmacy Interventions   Pharmacy Dicussed/Reviewed Pharmacy Topics Discussed, Medications and their functions, Medication Adherence, Pharmacy Topics Reviewed, Affording Medications  [Confirmed Ability to Afford Prescription Medications.]  Medication Adherence --  [Confirmed Compliance with Prescription Medications.]  Safety Interventions   Safety Discussed/Reviewed Safety Discussed, Safety Reviewed, Fall Risk, Home Safety  [Encouraged Routine Use of Home Assistive Devices & Durable Medical Equipment.]  Home Safety Assistive Devices, Need for home safety assessment, Contact provider for referral to PT/OT, Refer for community resources  [Encouraged Consideration of Home Safety Evaluation & Referral to Home Health Agency of Choice for Physical & Occupational Therapy Services.]  Advanced Directive Interventions   Advanced Directives Discussed/Reviewed Advanced Directives Discussed, Advanced Directives Reviewed, Advanced Care Planning, Guardianship  [Encouraged Initiation of Advanced Directives (Living Will & Healthcare Power of Corporate treasurer), Offering to [employer], Assist with Completion, Make Copies & Scan into Electronic Medical Record in Epic.]  Guardianship Provide resources, Refer to an agency  [Provided Wife, Nikil Mayon with Resources.]      Assessed Social Determinant of Health Barriers. Discussed Plans for Ongoing Care Management Follow Up. Provided Careers information officer Information for Care  Management Team Members. Screened for Signs & Symptoms of Depression, Related to Chronic Disease State.  PHQ2 & PHQ9 Depression Screen Completed & Results Reviewed.  Suicidal Ideation & Homicidal Ideation Assessed - None Present.   Domestic Violence Assessed - None Present. Access to Weapons Assessed - None Present.   Active Listening & Reflection Utilized.  Verbalization of Feelings Encouraged.  Emotional Support Provided. Feelings of Caregiver Burnout Validated. Caregiver Stress Acknowledged. Caregiver Resources Reviewed. Caregiver Support Groups Mailed. Self-Enrollment in Caregiver Support Group of Interest Emphasized. Crisis Support Information, Agencies, Services & Resources Discussed. Problem Solving Interventions Identified. Task-Centered Solutions Implemented.   Solution-Focused Strategies Developed. Acceptance & Commitment Therapy Introduced. Brief Cognitive Behavioral Therapy Initiated. Client-Centered Therapy Enacted. Reviewed Prescription Medications & Discussed Importance of Compliance. Quality of Sleep Assessed & Sleep Hygiene Techniques Promoted. CSW Collaboration with Wife, Desmin Beye to Verify No In-Home Care Services, [employer], Warden/ranger, Catering manager., Covered Under Firefighter through [employer].  CSW Collaboration with Wife, Ariam Lamper to Review Insurance Provider Benefits through [facility] & Encouraged Consideration of Applying for Medicaid, through [facility] [phone]). CSW Collaboration with Wife, Keaundre Gallet to Verify No Long-Term Care Insurance Benefits, Secondary Insurance Policies, Plans, Coverage, Etc.  CSW Collaboration with Wife, Vontavious Kroner to Confirm Neither Her or Patient Were Veterans, Making Them Ineligible to Apply for Aid & Attendance Benefits, Through [employer]. CSW Collaboration with Wife, Guerino Marczak to Request Review of The  Following List of [employer], [employer], [employer], Mailed on 12/18/2023: ~ Adult Day Care Programs  ~ In-Home Care & Respite Agencies ~ Home Health Care Agencies ~ Respite Care Agencies & Facilities ~ Personal Care Services Application  ~ Theatre manager Providers CSW Collaboration with Wife, Herve Bitting to Encourage Routine Engagement with [name], Licensed Clinical Social Worker with [facility] [phone]), if You Have Questions, Need Assistance, or If Additional Social Work Needs Are Identified Between Now & Our Next Follow-Up Outreach Call, Scheduled on 12/28/2023 at 3:30 PM.      Our next appointment is by telephone on 12/28/2023 at 3:30 pm.  Please call the care guide team at [phone] if you need to cancel or reschedule your appointment.   If you are  experiencing a Mental Health or Behavioral Health Crisis or need someone to talk to, please call the Suicide and Crisis Lifeline: 988 call the [place] National Suicide Prevention Lifeline: 831-773-3812 or TTY: 7627960576 TTY 812-626-6299) to talk to a trained counselor call 1-800-273-TALK (toll free, 24 hour hotline) go to [facility] [address] [place] [phone]) call the [facility] Crisis Line: [phone] call 911  Patient verbalizes understanding of instructions and care plan provided today and agrees to view in MyChart. Active MyChart status and patient understanding of how to access instructions and care plan via MyChart confirmed with patient.     Telephone follow up appointment with care management team member scheduled for:  12/28/2023 at 3:30 pm.  [name], BSW, MSW, LCSW  Embedded Practice Social Work Case Manager  [facility], [place], Population Health Direct Dial: [phone]  Fax: [phone] Email: [email or web address] Website: [email or web address]

## 2023-12-18 NOTE — Patient Outreach (Signed)
Care Coordination   Initial Visit Note   12/18/2023  Name: Blake Lawson MRN: [id number] DOB: Sep 29, 1935  Blake Lawson is a 88 y.o. year old male who sees Blake Peri, MD for primary care. I spoke with patient's wife, Blake Lawson by phone today.  What matters to the patients health and wellness today?  Provide Engineer, technical sales, Support, Museum/gallery conservator.    Goals Addressed             This Visit's Progress    Provide Engineer, technical sales, Support, Agencies & Resources.   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, Caregiver Burnout, Stress & Fatigue, Requires Assistance with Activities of Daily Living.]  General Interventions   General Interventions Discussed/Reviewed General Interventions Discussed, Labs, Vaccines, Doctor Visits, General Interventions Reviewed, Annual Eye Exam, Level of Care, [facility], Horticulturist, commercial (DME), Lipid Profile, Annual Foot Exam, Health Screening, Communication with  [Encouraged Routine Engagement with Care Team Members.]  Labs Hgb A1c every 3 months, Hgb A1c annually  [Encouraged Routine Blood Sugar Monitoring.]  Vaccines COVID-19, Flu, Pneumonia, RSV, Shingles, Tetanus/Pertussis/Diphtheria  [Encouraged Annual Vaccinations.]  Doctor Visits Discussed/Reviewed Doctor Visits Discussed, Specialist, Doctor Visits Reviewed, Annual Wellness Visits, PCP  [Encouraged Routine Engagement with Care Team Members.]  Health Screening Bone Density, Colonoscopy, Prostate  [Encouraged Annual Health Screenings.]  Durable Medical Equipment (DME) Glucomoter, Other, BP Cuff, Environmental consultant, Wheelchair  [Blood Pressure Cuff, [place], Scales, Prescription  Eyeglasses, Engineer, materials in Air traffic controller, Psychologist, prison and probation services Hose.]  Wheelchair Standard  PCP/Specialist Visits Compliance with follow-up visit  Communication with PCP/Specialists, Charity fundraiser, Pharmacists, Social Work  [employer] Routine Engagement with Care Team Members.]  Level of Care Adult Daycare, Air traffic controller, Assisted Living, Skilled Nursing Facility  [Confirmed Disinterest in Enrollment in Adult Day Care Program or Receiving Assistance Pursuing Higher Level of Care Placement Options (I.e Assisted Living Versus Skilled Nursing Facility).]  Applications Medicaid, Personal Care Services  [Confirmed Interest in Applying for Medicaid & Personal Care Services, Mailing Applications & Offering Assistance with Completion & Submission.]  Exercise Interventions   Exercise Discussed/Reviewed Exercise Discussed, Assistive device use and maintanence, Exercise Reviewed, Physical Activity, Weight Managment  [Encouraged Increased Level of Activity & Exercise, Inside & Outside the Home.]  Physical Activity Discussed/Reviewed Physical Activity Discussed, Home Exercise Program (HEP), PREP, Physical Activity Reviewed, Gym, Types of exercise  [Encouraged Daily Exercise Regimen, as Tolerated.]  Weight Management Weight loss  [Encouraged Healthy Weight Loss Program.]  Education Interventions   Education Provided Provided Therapist, sports, Provided Web-based Education, Provided Scientist, research (medical), Human resources officer Reviewed to Entertain Questions & Ensure Understanding.]  Provided Verbal Education On Nutrition, Foot Care, Eye Care, Labs, Blood Sugar Monitoring, Mental Health/Coping with Illness, When to see the doctor, Applications, Exercise, Medication, Development worker, community, [employer]  [employer] Consideration & Implementation of Educational Material.]  Labs Reviewed Hgb A1c  [Reviewed with Wife, Engineer, materials, Personal Care Services  [employer] Interest in Applying for [employer] &  Personal Care Services, Teaching laboratory technician & Offering Assistance with Completion & Submission.]  Mental Health Interventions   Mental Health Discussed/Reviewed Mental Health Discussed, Anxiety, Depression, Mental Health Reviewed, Grief and Loss, Substance Abuse, Coping Strategies, Suicide, Crisis, Other  [Assessed Mental Health & Cognitive Status.]  Nutrition Interventions   Nutrition Discussed/Reviewed Nutrition Discussed, Adding fruits and vegetables, Increasing proteins, Decreasing fats, Nutrition Reviewed, Fluid intake, Decreasing salt, Portion sizes, Carbohydrate meal planning, Decreasing sugar intake  [Encouraged Heart-Healthy, Diabetic-Friendly, Low Sodium, Reduced Fat Diet.]  Pharmacy Interventions   Pharmacy Dicussed/Reviewed Pharmacy Topics Discussed, Medications and their functions, Medication Adherence, Pharmacy Topics Reviewed, Affording Medications  [Confirmed Ability to Afford Prescription Medications.]  Medication Adherence --  [Confirmed Compliance with Prescription Medications.]  Safety Interventions   Safety Discussed/Reviewed Safety Discussed, Safety Reviewed, Fall Risk, Home Safety  [Encouraged Routine Use of Home Assistive Devices & Durable Medical Equipment.]  Home Safety Assistive Devices, Need for home safety assessment, Contact provider for referral to PT/OT, Refer for community resources  [Encouraged Consideration of Home Safety Evaluation & Referral to Home Health Agency of Choice for Physical & Occupational Therapy Services.]  Advanced Directive Interventions   Advanced Directives Discussed/Reviewed Advanced Directives Discussed, Advanced Directives Reviewed, Advanced Care Planning, Guardianship  [Encouraged Initiation of Advanced Directives (Living Will & Healthcare Power of Corporate treasurer), Offering to [employer], Assist with Completion, Make Copies & Scan into Electronic Medical Record in Epic.]  Guardianship Provide resources, Refer to an agency  [Provided Wife,  [name] with Resources.]      Assessed Social Determinant of Health Barriers. Discussed Plans for Ongoing Care Management Follow Up. Provided Careers information officer Information for Care Management Team Members. Screened for Signs & Symptoms of Depression, Related to Chronic Disease State.  PHQ2 & PHQ9 Depression Screen Completed & Results Reviewed.  Suicidal Ideation & Homicidal Ideation Assessed - None Present.   Domestic Violence Assessed - None Present. Access to Weapons Assessed - None Present.   Active Listening & Reflection Utilized.  Verbalization of Feelings Encouraged.  Emotional Support Provided. Feelings of Caregiver Burnout Validated. Caregiver Stress Acknowledged. Caregiver Resources Reviewed. Caregiver Support Groups Mailed. Self-Enrollment in Caregiver Support Group of Interest Emphasized. Crisis Support Information, Agencies, Services & Resources Discussed. Problem Solving Interventions Identified. Task-Centered Solutions Implemented.   Solution-Focused Strategies Developed. Acceptance & Commitment Therapy Introduced. Brief Cognitive Behavioral Therapy Initiated. Client-Centered Therapy Enacted. Reviewed Prescription Medications & Discussed Importance of Compliance. Quality of Sleep Assessed & Sleep Hygiene Techniques Promoted. CSW Collaboration with Wife, Davius Willock to Verify No In-Home Care Services, [employer], Warden/ranger, Catering manager., Covered Under Firefighter through [employer].  CSW Collaboration with Wife, Thadd Mauk to Review Insurance Provider Benefits through [facility] & Encouraged Consideration of Applying for Medicaid, through [facility] [phone]). CSW Collaboration with Wife, Braven Hashagen to Verify No Long-Term Care Insurance Benefits, Secondary Insurance Policies, Plans, Coverage, Etc.  CSW Collaboration with Wife, Shlome Garrido to Confirm Neither Her or  Patient Were Veterans, Making Them Ineligible to Apply for Aid & Attendance Benefits, Through [employer]. CSW Collaboration with Wife, Eesa Tyska to Request Review of The Following List of [employer], [employer], [employer], Mailed on 12/18/2023: ~ Adult Day Care Programs  ~ In-Home Care & Respite Agencies ~ Home Health Care Agencies ~ Respite Care Agencies & Facilities ~ Personal Care Services Application  ~ Development worker, international aid Agency Providers CSW Collaboration with Wife, Adonias Abud to Encourage Routine Engagement with [name], Licensed Clinical Social Worker with [facility] [phone]), if You Have Questions, Need Assistance, or If Additional Social Work Needs Are Identified Between Now & Our Next Follow-Up Outreach Call, Scheduled on 12/28/2023 at 3:30 PM.        SDOH  assessments and interventions completed:  Yes.  SDOH Interventions Today    Flowsheet Row Most Recent Value  SDOH Interventions   Food Insecurity Interventions Intervention Not Indicated  Housing Interventions Intervention Not Indicated  Transportation Interventions Intervention Not Indicated, Patient Resources (Friends/Family)  Utilities Interventions Intervention Not Indicated  Alcohol Usage Interventions Intervention Not Indicated (Score <7)  Financial Strain Interventions Intervention Not Indicated  Physical Activity Interventions Patient Declined  Stress Interventions Intervention Not Indicated  Social Connections Interventions Intervention Not Indicated  Health Literacy Interventions Intervention Not Indicated  [Wife, Chimaobi Gargus Attends All Appointments.]     Care Coordination Interventions:  Yes, provided.   Follow up plan: Follow up call scheduled for 12/28/2023 at 3:30 pm.  Encounter Outcome:  Patient Visit Completed.   [name], BSW, MSW, Printmaker Social Work Case Set designer Health  [facility], Population Health Direct Dial: [phone]  Fax: [phone] Email: [email or web address]: [email or web address]

## 2023-12-19 ENCOUNTER — Encounter: Payer: Medicare PPO | Admitting: *Deleted

## 2023-12-22 DIAGNOSIS — I10 Essential (primary) hypertension: Secondary | ICD-10-CM | POA: Diagnosis not present

## 2023-12-22 DIAGNOSIS — R413 Other amnesia: Secondary | ICD-10-CM | POA: Diagnosis not present

## 2023-12-22 DIAGNOSIS — E1169 Type 2 diabetes mellitus with other specified complication: Secondary | ICD-10-CM | POA: Diagnosis not present

## 2023-12-22 DIAGNOSIS — R0602 Shortness of breath: Secondary | ICD-10-CM | POA: Diagnosis not present

## 2023-12-22 DIAGNOSIS — R609 Edema, unspecified: Secondary | ICD-10-CM | POA: Diagnosis not present

## 2023-12-22 DIAGNOSIS — Z299 Encounter for prophylactic measures, unspecified: Secondary | ICD-10-CM | POA: Diagnosis not present

## 2023-12-22 DIAGNOSIS — R42 Dizziness and giddiness: Secondary | ICD-10-CM | POA: Diagnosis not present

## 2023-12-28 ENCOUNTER — Ambulatory Visit: Payer: Self-pay | Admitting: *Deleted

## 2023-12-28 ENCOUNTER — Encounter: Payer: Self-pay | Admitting: *Deleted

## 2023-12-28 NOTE — Patient Instructions (Signed)
 Visit Information  Thank you for taking time to visit with me today. Please don't hesitate to contact me if I can be of assistance to you.   Following are the goals we discussed today:   Goals Addressed             This Visit's Progress    Provide Engineer, Technical Sales, Support, Agencies & Resources.   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, Caregiver Burnout, Stress & Fatigue, Requires Assistance with Activities of Daily Living.]  General Interventions   General Interventions Discussed/Reviewed General Interventions Discussed, Labs, Vaccines, Doctor Visits, General Interventions Reviewed, Annual Eye Exam, Level of Care, [employer], Horticulturist, Commercial (DME), Lipid Profile, Annual Foot Exam, Health Screening, Communication with  [Encouraged Routine Engagement with Care Team Members.]  Labs Hgb A1c every 3 months, Hgb A1c annually  [Encouraged Routine Blood Sugar Monitoring.]  Vaccines COVID-19, Flu, Pneumonia, RSV, Shingles, Tetanus/Pertussis/Diphtheria  [Encouraged Annual Vaccinations.]  Doctor Visits Discussed/Reviewed Doctor Visits Discussed, Specialist, Doctor Visits Reviewed, Annual Wellness Visits, PCP  [Encouraged Routine Engagement with Care Team Members.]  Health Screening Bone Density, Colonoscopy, Prostate  [Encouraged Annual Health Screenings.]  Durable Medical Equipment (DME) Glucomoter, Other, BP Cuff, Environmental Consultant, Wheelchair  [Blood Pressure Cuff, [place], Scales, Prescription Eyeglasses, Engineer, Materials in Air Traffic Controller, Psychologist, [employer]ation Services Hose.]  Wheelchair Standard  PCP/Specialist Visits Compliance with follow-up visit  Communication with PCP/Specialists, CHARITY FUNDRAISER, Pharmacists, Social  Work  [employer] Routine Engagement with Care Team Members.]  Level of Care Adult Daycare, Air Traffic Controller, Assisted Living, Skilled Nursing Facility  [Confirmed Disinterest in Enrollment in Adult Day Care Program or Receiving Assistance Pursuing Higher Level of Care Placement Options (I.e Assisted Living Versus Skilled Nursing Facility).]  Applications Medicaid, Personal Care Services  [Confirmed Interest in Applying for Medicaid & Personal Care Services, Mailing Applications & Offering Assistance with Completion & Submission.]  Exercise Interventions   Exercise Discussed/Reviewed Exercise Discussed, Assistive device use and maintanence, Exercise Reviewed, Physical Activity, Weight Managment  [Encouraged Increased Level of Activity & Exercise, Inside & Outside the Home.]  Physical Activity Discussed/Reviewed Physical Activity Discussed, Home Exercise Program (HEP), PREP, Physical Activity Reviewed, Gym, Types of exercise  [Encouraged Daily Exercise Regimen, as Tolerated.]  Weight Management Weight loss  [Encouraged Healthy Weight Loss Program.]  Education Interventions   Education Provided Provided Therapist, Sports, Provided Web-based Education, Provided Scientist, Research (medical), Human Resources Officer Reviewed to Entertain Questions & Ensure Understanding.]  Provided Verbal Education On Nutrition, Foot Care, Eye Care, Labs, Blood Sugar Monitoring, Mental Health/Coping with Illness, When to see the doctor, Applications, Exercise, Medication, Development Worker, Community, [employer] Resources  [employer] Consideration & Implementation of Educational Material.]  Labs Reviewed Hgb A1c  [Reviewed with Wife, Engineer, Materials, Personal Care Services  [employer] Interest in Applying for [employer] & Personal Care Services, Teaching Laboratory Technician & Offering Assistance with Completion & Submission.]  Mental Health Interventions   Mental Health Discussed/Reviewed Mental Health Discussed, Anxiety,  Depression, Mental Health Reviewed, Grief and Loss, Substance Abuse, Coping Strategies, Suicide, Crisis, Other  [Assessed Mental Health & Cognitive Status.]  Nutrition Interventions   Nutrition Discussed/Reviewed Nutrition Discussed, Adding fruits and vegetables, Increasing proteins, Decreasing fats, Nutrition Reviewed, Fluid intake, Decreasing salt, Portion sizes, Carbohydrate meal planning, Decreasing sugar intake  [  Encouraged Heart-Healthy, Diabetic-Friendly, Low Sodium, Reduced Fat Diet.]  Pharmacy Interventions   Pharmacy Dicussed/Reviewed Pharmacy Topics Discussed, Medications and their functions, Medication Adherence, Pharmacy Topics Reviewed, Affording Medications  [Confirmed Ability to Afford Prescription Medications.]  Medication Adherence --  [Confirmed Compliance with Prescription Medications.]  Safety Interventions   Safety Discussed/Reviewed Safety Discussed, Safety Reviewed, Fall Risk, Home Safety  [Encouraged Routine Use of Home Assistive Devices & Durable Medical Equipment.]  Home Safety Assistive Devices, Need for home safety assessment, Contact provider for referral to PT/OT, Refer for community resources  [Encouraged Consideration of Home Safety Evaluation & Referral to Home Health Agency of Choice for Physical & Occupational Therapy Services.]  Advanced Directive Interventions   Advanced Directives Discussed/Reviewed Advanced Directives Discussed, Advanced Directives Reviewed, Advanced Care Planning, Guardianship  [Encouraged Initiation of Advanced Directives (Living Will & Healthcare Power of Corporate Treasurer), Offering to [employer], Assist with Completion, Make Copies & Scan into Electronic Medical Record in Epic.]  Guardianship Provide resources, Refer to an agency  [Provided Wife, Cristen Murcia with Resources.]      Active Listening & Reflection Utilized.  Verbalization of Feelings Encouraged.  Emotional Support Provided. Problem Solving Interventions  Activated. Task-Centered Solutions Employed.   Solution-Focused Strategies Implemented. Acceptance & Commitment Therapy Indicated. Cognitive Behavioral Therapy Initiated. Client-Centered Therapy Performed. CSW Collaboration with Wife, Clearnce Leja to [employer] Receipt, Thoroughly Review List of Longs Drug Stores, Control And Instrumentation Engineer with Agencies of Interest & Complete Applications for Personal Care Services: ~ Adult Day Care Programs  ~ In-Home Care & Respite Agencies ~ Home Health Care Agencies ~ Respite Care Agencies & Facilities ~ Personal Care Services Application  ~ Theatre Manager Providers CSW Collaboration with Wife, Elizjah Noblet to Encourage Routine Engagement with Philippe Desanctis, Licensed Clinical Social Worker with [employer] [phone]), if You Have Questions, Need Assistance, or If Additional Social Work Needs Are Identified Between Now & Our Next Follow-Up Outreach Call, Scheduled on 01/19/2024 at 9:45 AM.      Our next appointment is by telephone on 01/19/2024 at 9:45 am.  Please call the care guide team at [phone] if you need to cancel or reschedule your appointment.   If you are experiencing a Mental Health or Behavioral Health Crisis or need someone to talk to, please call the Suicide and Crisis Lifeline: 988 call the [place]  National Suicide Prevention Lifeline: (512)513-2882 or TTY: (315)333-3735 TTY 812-547-8691) to talk to a trained counselor call 1-800-273-TALK (toll free, 24 hour hotline) go to [facility] [address] [place] [phone]) call the [facility] Crisis Line: [phone] call 911  Patient verbalizes understanding of instructions and care plan provided today and agrees to view in MyChart. Active MyChart status and patient understanding of how to access instructions and care plan via MyChart confirmed with patient.     Telephone follow up  appointment with care management team member scheduled for:   01/19/2024 at 9:45 am.  Philippe Desanctis, BSW, MSW, LCSW  Embedded Practice Social Work Case Manager  [facility], Population Health Direct Dial: [phone]  Fax: [phone] Email: [email or web address]: [email or web address]

## 2023-12-28 NOTE — Patient Outreach (Signed)
 Care Coordination   Follow Up Visit Note   12/28/2023  Name: Blake Lawson MRN: [id number] DOB: Feb 21, 1935  Blake Lawson is a 89 y.o. year old male who sees [name], MD for primary care. I spoke with Lamar FORBES Benders by phone today.  What matters to the patients health and wellness today?  Provide Engineer, Technical Sales, Support, Museum/gallery Conservator.    Goals Addressed             This Visit's Progress    Provide Engineer, Technical Sales, Support, Agencies & Resources.   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, Caregiver Burnout, Stress & Fatigue, Requires Assistance with Activities of Daily Living.]  General Interventions   General Interventions Discussed/Reviewed General Interventions Discussed, Labs, Vaccines, Doctor Visits, General Interventions Reviewed, Annual Eye Exam, Level of Care, [facility], Horticulturist, Commercial (DME), Lipid Profile, Annual Foot Exam, Health Screening, Communication with  [Encouraged Routine Engagement with Care Team Members.]  Labs Hgb A1c every 3 months, Hgb A1c annually  [Encouraged Routine Blood Sugar Monitoring.]  Vaccines COVID-19, Flu, Pneumonia, RSV, Shingles, Tetanus/Pertussis/Diphtheria  [Encouraged Annual Vaccinations.]  Doctor Visits Discussed/Reviewed Doctor Visits Discussed, Specialist, Doctor Visits Reviewed, Annual Wellness Visits, PCP  [Encouraged Routine Engagement with Care Team Members.]  Health Screening Bone Density, Colonoscopy, Prostate  [Encouraged Annual Health Screenings.]  Durable Medical Equipment (DME) Glucomoter, Other, BP Cuff, Environmental Consultant, Wheelchair  [Blood Pressure Cuff, [place], Scales, Prescription Eyeglasses, Printmaker in Air Traffic Controller, Psychologist, [employer] Probation Services Hose.]  Wheelchair Standard  PCP/Specialist Visits Compliance with follow-up visit  Communication with PCP/Specialists, CHARITY FUNDRAISER, Pharmacists, Social Work  [employer] Routine Engagement with Care Team Members.]  Level of Care Adult Daycare, Air Traffic Controller, Assisted Living, Skilled Nursing Facility  [Confirmed Disinterest in Enrollment in Adult Day Care Program or Receiving Assistance Pursuing Higher Level of Care Placement Options (I.e Assisted Living Versus Skilled Nursing Facility).]  Applications Medicaid, Personal Care Services  [Confirmed Interest in Applying for Medicaid & Personal Care Services, Mailing Applications & Offering Assistance with Completion & Submission.]  Exercise Interventions   Exercise Discussed/Reviewed Exercise Discussed, Assistive device use and maintanence, Exercise Reviewed, Physical Activity, Weight Managment  [Encouraged Increased Level of Activity & Exercise, Inside & Outside the Home.]  Physical Activity Discussed/Reviewed Physical Activity Discussed, Home Exercise Program (HEP), PREP, Physical Activity Reviewed, Gym, Types of exercise  [Encouraged Daily Exercise Regimen, as Tolerated.]  Weight Management Weight loss  [Encouraged Healthy Weight Loss Program.]  Education Interventions   Education Provided Provided Therapist, Sports, Provided Web-based Education, Provided Scientist, Research (medical), Human Resources Officer Reviewed to Entertain Questions & Ensure Understanding.]  Provided Verbal Education On Nutrition, Foot Care, Eye Care, Labs, Blood Sugar Monitoring, Mental Health/Coping with Illness, When to see the doctor, Applications, Exercise, Medication, Development Worker, Community, [employer] Resources  [employer] Consideration & Implementation of Educational Material.]  Labs Reviewed Hgb A1c  [Reviewed with Wife, Engineer, Materials, Personal Care Services  [employer] Interest in Applying for [employer] & Personal Care Services,  Teaching Laboratory Technician & Offering Assistance with Completion & Submission.]  Mental Health Interventions   Mental Health Discussed/Reviewed Mental Health Discussed, Anxiety, Depression, Mental Health Reviewed, Grief and Loss, Substance Abuse, Coping Strategies, Suicide, Crisis, Other  [Assessed Mental Health & Cognitive Status.]  Nutrition Interventions   Nutrition Discussed/Reviewed Nutrition Discussed, Adding fruits and vegetables, Increasing proteins, Decreasing fats, Nutrition Reviewed, Fluid intake, Decreasing salt, Portion sizes, Carbohydrate meal planning, Decreasing sugar intake  [Encouraged Heart-Healthy, Diabetic-Friendly, Low Sodium, Reduced Fat Diet.]  Pharmacy Interventions   Pharmacy Dicussed/Reviewed Pharmacy Topics Discussed, Medications and their functions, Medication Adherence, Pharmacy Topics Reviewed, Affording Medications  [Confirmed Ability to Afford Prescription Medications.]  Medication Adherence --  [Confirmed Compliance with Prescription Medications.]  Safety Interventions   Safety Discussed/Reviewed Safety Discussed, Safety Reviewed, Fall Risk, Home Safety  [Encouraged Routine Use of Home Assistive Devices & Durable Medical Equipment.]  Home Safety Assistive Devices, Need for home safety assessment, Contact provider for referral to PT/OT, Refer for community resources  [Encouraged Consideration of Home Safety Evaluation & Referral to Home Health Agency of Choice for Physical & Occupational Therapy Services.]  Advanced Directive Interventions   Advanced Directives Discussed/Reviewed Advanced Directives Discussed, Advanced Directives Reviewed, Advanced Care Planning, Guardianship  [Encouraged Initiation of Advanced Directives (Living Will & Healthcare Power of Corporate Treasurer), Offering to [employer], Assist with Completion, Make Copies & Scan into Electronic Medical Record in Epic.]  Guardianship Provide resources, Refer to an agency  [Provided Wife, Estill Llerena with  Resources.]      Active Listening & Reflection Utilized.  Verbalization of Feelings Encouraged.  Emotional Support Provided. Problem Solving Interventions Activated. Task-Centered Solutions Employed.   Solution-Focused Strategies Implemented. Acceptance & Commitment Therapy Indicated. Cognitive Behavioral Therapy Initiated. Client-Centered Therapy Performed. CSW Collaboration with Wife, Ramez Arrona to [employer] Receipt, Thoroughly Review List of Longs Drug Stores, Control And Instrumentation Engineer with Agencies of Interest & Complete Air Traffic Controller for Personal Care Services: ~ Adult Day Care Programs  ~ In-Home Care & Respite Agencies ~ Home Health Care Agencies ~ Respite Care Agencies & Facilities ~ Personal Care Services Application  ~ Theatre Manager Providers CSW Collaboration with Wife, Otis Portal to Encourage Routine Engagement with Philippe Desanctis, Licensed Clinical Social Worker with [facility] [phone]), if You Have Questions, Need Assistance, or If Additional Social Work Needs Are Identified Between Now & Our Next Follow-Up Outreach Call, Scheduled on 01/19/2024 at 9:45 AM.      SDOH assessments and interventions completed:  Yes.  Care Coordination Interventions:  Yes, provided.   Follow up plan: Follow up call scheduled for 01/19/2024 at 9:45 am.  Encounter Outcome:  Patient Visit Completed.   Philippe Desanctis, BSW, MSW, Printmaker Social Work Case Set Designer Health  [facility], Population Health Direct Dial: [phone]  Fax: [phone] Email: [email or web address] Website: [email or web address]

## 2023-12-28 NOTE — Patient Outreach (Signed)
 Care Coordination   Follow Up Visit Note   12/28/2023 Name: Blake Lawson MRN: [id number] DOB: 10-Apr-1935  Blake Lawson is a 89 y.o. year old male who sees Blake Isles, MD for primary care. I  spoke with wife, Blake Lawson, by telephone today.  What matters to the patients health and wellness today?  Wife would like for patient to have cognitive evaluation and patient will follow-up with ENT regarding mastoid effusion. This may contribute to dizziness.     Goals Addressed             This Visit's Progress    Improve Balance and Decrease Falls   On track    Care Coordination Goals: Patient will use cane for balance and practice walking with that Patient will move carefully and change positions slowly to decrease risk for falls Patient will report any falls to provider and will seek medical attention if necessary Patient will report any new or worsening symptoms to provider or seek appropriate medical attention Patient/wife will schedule appointment with ENT (requested referral from PCP) for mastoid effusion seen on MRI. This may have an effect on his balance.  Patient will reach out to RN Care Coordinator [phone] with any resource or care coordination needs     Manage Cognitive Changes & Behavioral Disturbance (Wife's Goal)   On track    Care Management Needs: Patient will take medication as prescribed and will allow wife to assist with medication management Patient will keep follow-up appointment with PCP office on 01/03/24 for cognitive screening and follow-up with PCP regarding results Wife will keep herself safe and will call 911 if patient becomes violent or is a threat to himself or to others Wife will talk with LCSW Re: level of care concerns Wife will remain in contact with patient's children regarding his status and will reach out if she needs assistance with him Patient will avoid alcohol  Patent will limit driving and avoid if possible Patient/wife will reach out  to RN Care Manager at [phone] with any care management needs          SDOH assessments and interventions completed:  No     Care Coordination Interventions:  Yes, provided  Interventions Today    Flowsheet Row Most Recent Value  Chronic Disease   Chronic disease during today's visit Other, Diabetes, Hypertension (HTN)  [cognitive changes and behavioral disturbances, dizziness]  General Interventions   General Interventions Discussed/Reviewed General Interventions Discussed, General Interventions Reviewed, Doctor Visits  Doctor Visits Discussed/Reviewed Doctor Visits Discussed, Doctor Visits Reviewed, PCP, Specialist  Durable Medical Equipment (DME) Wheelchair, Walker  Wheelchair Standard  PCP/Specialist Visits Compliance with follow-up visit  [keep appointment at PCP office on 01/03/24 for cognitive screening and follow-up with PCP RE: results. Keep appointment with ENT in January for mastoid effusion]  Exercise Interventions   Exercise Discussed/Reviewed Physical Activity, Assistive device use and maintanence  Physical Activity Discussed/Reviewed Physical Activity Discussed, Physical Activity Reviewed  [encouraged to use assistive devices for ambulation and to increase activity as tolerated. Discouraged driving, especially during periods of confusion.]  Education Interventions   Education Provided Provided Education  Provided Verbal Education On Medication, When to see the doctor, Mental Health/Coping with Illness, Other  [caregiver strain and safety. Son is living with them currently and can provide assistance with patient.]  Mental Health Interventions   Mental Health Discussed/Reviewed Mental Health Discussed, Mental Health Reviewed  [cognitive changes and behavioral distrubances. Wife will continue to talk with LCSW regarding caregiver strain.]  Nutrition Interventions   Nutrition Discussed/Reviewed Nutrition Discussed, Nutrition Reviewed, Carbohydrate meal planning, Portion  sizes  Pharmacy Interventions   Pharmacy Dicussed/Reviewed Pharmacy Topics Discussed, Pharmacy Topics Reviewed, Medications and their functions  Safety Interventions   Safety Discussed/Reviewed Safety Discussed, Safety Reviewed, Fall Risk, Home Safety  Home Safety Assistive Devices       Follow up plan: Follow up call scheduled for 01/11/24    Encounter Outcome:  Patient Visit Completed   Blake Pellet, RN, BSN Care Manager [place]  [facility]  Population Health  Direct Dial: [phone] Main #: [phone]

## 2024-01-04 DIAGNOSIS — H401132 Primary open-angle glaucoma, bilateral, moderate stage: Secondary | ICD-10-CM | POA: Diagnosis not present

## 2024-01-11 ENCOUNTER — Encounter: Payer: Self-pay | Admitting: *Deleted

## 2024-01-11 ENCOUNTER — Ambulatory Visit: Payer: Self-pay | Admitting: *Deleted

## 2024-01-11 NOTE — Patient Outreach (Signed)
Care Coordination   Follow Up Visit Note   01/11/2024 Name: Blake Lawson MRN: [id number] DOB: [birth year]  Blake Lawson is a 89 y.o. year old male who sees Blake Peri, MD for primary care. I spoke with  Blake Lawson by phone today and briefly to his wife and caregiver, Blake Lawson.  What matters to the patients health and wellness today?  "I would like to get back to the way I was and be able to get around good."    Goals Addressed             This Visit's Progress    Improve Balance and Decrease Falls   On track    Care Coordination Goals: Patient will use cane for balance Patient will move carefully and change positions slowly to decrease risk for falls Patient will report any falls to provider and will seek medical attention if necessary Patient will report any new or worsening symptoms to provider or seek appropriate medical attention Patient will keep appointment with ENT at the end of January to evaluate mastoid effusion Patient will reach out to RN Care Coordinator [phone] with any resource or care coordination needs     Manage Blood Pressure & Heart Rate   On track    Care Coordination Goals: Patient will take mediation as directed and will use a pill organizer to manage medications Patient will monitor and record blood pressure and heart rate daily and as needed and will call PCP or specialist with any readings outside of recommended range Patient will keep all recommended follow-up appointments with PCP, specialists, and for imaging or testing Patient will take blood pressure log to PCP and specialty appointments for review Patient will reach out to RN Care Coordinator [phone] with any care coordination or resource needs      Manage Blood Sugar   On track    Care Coordination Goals: Patient will take medication as prescribed and use a pill organizer Patient will monitor and record blood sugar 2 times daily and as needed with glucometer, and will call PCP  with any readings outside of recommended range Patient will take blood sugar log and meter to provider visits for review Patient will eat meals at regular intervals and not skip meals 3 meals per day with 30 grams of carbohydrates and up to 2 snacks, if needed, with less than 15 grams of carbohyrates Patient will increase activity level as tolerated with an ultimate goal of at least 150 minutes of exercise per week Patient will reach out to [facility] Coordinator [phone] with any care coordination or resource needs      Manage Cognitive Changes & Behavioral Disturbance (Wife's Goal)   Not on track    Care Management Needs: Patient will take medication as prescribed and will allow wife to assist with medication management Patient will follow-up with PCP for cognitive screening Wife will keep herself safe and will call 911 if patient becomes violent or is a threat to himself or to others Wife will talk with LCSW Re: level of care concerns Wife will remain in contact with patient's children regarding his status and will reach out if she needs assistance with him Patient will avoid alcohol Patent will limit driving and avoid if possible Patient/wife will reach out to RN Care Manager at [phone] with any care management needs        SDOH assessments and interventions completed:  Yes  SDOH Interventions Today    Flowsheet Row Most Recent Value  SDOH Interventions   Housing Interventions Intervention Not Indicated  Transportation Interventions Patient Resources (Friends/Family)  [drives himself locally]        Care Coordination Interventions:  Yes, provided  Interventions Today    Flowsheet Row Most Recent Value  Chronic Disease   Chronic disease during today's visit Diabetes, Hypertension (HTN), Other  [cognitive changes and behavioral disturbances, dizziness, mastoid effusion]  General Interventions   General Interventions Discussed/Reviewed General Interventions Discussed,  General Interventions Reviewed, Durable Medical Equipment (DME), Doctor Visits, Communication with  Doctor Visits Discussed/Reviewed Doctor Visits Discussed, Doctor Visits Reviewed, PCP, Specialist, Annual Wellness Visits  Durable Medical Equipment (DME) Glucomoter, BP Cuff, Other  [did not have readings to review but reports checking blood sugar and blood pressure at least a few times per week. Using cane for ambulation.]  PCP/Specialist Visits Compliance with follow-up visit  [reschedule Appt. at PCP office for cognitive screening. Keep appointment with ENT at the end of January to evaluate Mastoid effusion and dizziness.]  Communication with Social Work  [employer] message to [employer], LCSW to update on current status]  Exercise Interventions   Exercise Discussed/Reviewed Physical Activity  Physical Activity Discussed/Reviewed Physical Activity Discussed, Physical Activity Reviewed  [encouraged to remain active and discussed negative impact of being sedentary on overal health]  Education Interventions   Education Provided Provided Education  Provided Verbal Education On Nutrition, Blood Sugar Monitoring, Other, When to see the doctor, Exercise, Medication  [blood pressure monitoring]  Mental Health Interventions   Mental Health Discussed/Reviewed Mental Health Discussed, Mental Health Reviewed, Other  [cognitive changes. Patient endorses forgetting appointments and medications. Denies getting lost while driving and was able to tell me what he had for breakfast this morning and supper yesterday although I wasn't able to verify accuracy. F/U with LCSW.]  Nutrition Interventions   Nutrition Discussed/Reviewed Nutrition Discussed, Nutrition Reviewed, Adding fruits and vegetables, Fluid intake, Portion sizes, Carbohydrate meal planning, Decreasing sugar intake, Increasing proteins, Decreasing fats, Decreasing salt  [eat 3 meals per day. Avoid sugary drinks and foods.]  Pharmacy Interventions    Pharmacy Dicussed/Reviewed Pharmacy Topics Discussed, Pharmacy Topics Reviewed, Medications and their functions  [use a pill organizer and reminder system for mediations. Take daily as directed.]  Safety Interventions   Safety Discussed/Reviewed Safety Discussed, Safety Reviewed, Fall Risk, Home Safety  Home Safety Assistive Devices       Follow up plan: Follow up call scheduled for 01/25/24    Encounter Outcome:  Patient Visit Completed   Demetrios Loll, RN, BSN [place]  [facility], [facility] RN Care Manager Direct Dial: [phone]

## 2024-01-14 DIAGNOSIS — Z6822 Body mass index (BMI) 22.0-22.9, adult: Secondary | ICD-10-CM | POA: Diagnosis not present

## 2024-01-14 DIAGNOSIS — H6001 Abscess of right external ear: Secondary | ICD-10-CM | POA: Diagnosis not present

## 2024-01-17 DIAGNOSIS — R413 Other amnesia: Secondary | ICD-10-CM | POA: Diagnosis not present

## 2024-01-18 DIAGNOSIS — H6000 Abscess of external ear, unspecified ear: Secondary | ICD-10-CM | POA: Diagnosis not present

## 2024-01-18 DIAGNOSIS — I10 Essential (primary) hypertension: Secondary | ICD-10-CM | POA: Diagnosis not present

## 2024-01-18 DIAGNOSIS — R52 Pain, unspecified: Secondary | ICD-10-CM | POA: Diagnosis not present

## 2024-01-18 DIAGNOSIS — I739 Peripheral vascular disease, unspecified: Secondary | ICD-10-CM | POA: Diagnosis not present

## 2024-01-18 DIAGNOSIS — E1169 Type 2 diabetes mellitus with other specified complication: Secondary | ICD-10-CM | POA: Diagnosis not present

## 2024-01-18 DIAGNOSIS — Z299 Encounter for prophylactic measures, unspecified: Secondary | ICD-10-CM | POA: Diagnosis not present

## 2024-01-19 ENCOUNTER — Ambulatory Visit: Payer: Self-pay | Admitting: *Deleted

## 2024-01-19 NOTE — Patient Instructions (Signed)
Visit Information  Thank you for taking time to visit with me today. Please don't hesitate to contact me if I can be of assistance to you.   Following are the goals we discussed today:   Goals Addressed             This Visit's Progress    Provide Engineer, technical sales, Support, Agencies & Resources.   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, Caregiver Burnout, Stress & Fatigue, Requires Assistance with Activities of Daily Living.]  General Interventions   General Interventions Discussed/Reviewed General Interventions Discussed, Labs, Vaccines, Doctor Visits, General Interventions Reviewed, Annual Eye Exam, Level of Care, [employer], Horticulturist, commercial (DME), Lipid Profile, Annual Foot Exam, Health Screening, Communication with  [Encouraged Routine Engagement with Care Team Members.]  Labs Hgb A1c every 3 months, Hgb A1c annually  [Encouraged Routine Blood Sugar Monitoring.]  Vaccines COVID-19, Flu, Pneumonia, RSV, Shingles, Tetanus/Pertussis/Diphtheria  [Encouraged Annual Vaccinations.]  Doctor Visits Discussed/Reviewed Doctor Visits Discussed, Specialist, Doctor Visits Reviewed, Annual Wellness Visits, PCP  [Encouraged Routine Engagement with Care Team Members.]  Health Screening Bone Density, Colonoscopy, Prostate  [Encouraged Annual Health Screenings.]  Durable Medical Equipment (DME) Glucomoter, Other, BP Cuff, Environmental consultant, Wheelchair  [Blood Pressure Cuff, [place], Scales, Prescription Eyeglasses, Engineer, materials in Air traffic controller, Psychologist, prison and probation services Hose.]  Wheelchair Standard  PCP/Specialist Visits Compliance with follow-up visit  Communication with PCP/Specialists, Charity fundraiser, Pharmacists, Social  Work  [employer] Routine Engagement with Care Team Members.]  Level of Care Adult Daycare, Air traffic controller, Assisted Living, Skilled Nursing Facility  [Confirmed Disinterest in Enrollment in Adult Day Care Program or Receiving Assistance Pursuing Higher Level of Care Placement Options (I.e Assisted Living Versus Skilled Nursing Facility).]  Applications Medicaid, Personal Care Services  [Confirmed Interest in Applying for Medicaid & Personal Care Services, Mailing Applications & Offering Assistance with Completion & Submission.]  Exercise Interventions   Exercise Discussed/Reviewed Exercise Discussed, Assistive device use and maintanence, Exercise Reviewed, Physical Activity, Weight Managment  [Encouraged Increased Level of Activity & Exercise, Inside & Outside the Home.]  Physical Activity Discussed/Reviewed Physical Activity Discussed, Home Exercise Program (HEP), PREP, Physical Activity Reviewed, Gym, Types of exercise  [Encouraged Daily Exercise Regimen, as Tolerated.]  Weight Management Weight loss  [Encouraged Healthy Weight Loss Program.]  Education Interventions   Education Provided Provided Therapist, sports, Provided Web-based Education, Provided Scientist, research (medical), Human resources officer Reviewed to Entertain Questions & Ensure Understanding.]  Provided Verbal Education On Nutrition, Foot Care, Eye Care, Labs, Blood Sugar Monitoring, Mental Health/Coping with Illness, When to see the doctor, Applications, Exercise, Medication, Development worker, community, [employer]  [employer] Consideration & Implementation of Educational Material.]  Labs Reviewed Hgb A1c  [Reviewed with Wife, Engineer, materials, Personal Care Services  [employer] Interest in Applying for [employer] & Personal Care Services, Teaching laboratory technician & Offering Assistance with Completion & Submission.]  Mental Health Interventions   Mental Health Discussed/Reviewed Mental Health Discussed, Anxiety,  Depression, Mental Health Reviewed, Grief and Loss, Substance Abuse, Coping Strategies, Suicide, Crisis, Other  [Assessed Mental Health & Cognitive Status.]  Nutrition Interventions   Nutrition Discussed/Reviewed Nutrition Discussed, Adding fruits and vegetables, Increasing proteins, Decreasing fats, Nutrition Reviewed, Fluid intake, Decreasing salt, Portion sizes, Carbohydrate meal planning, Decreasing sugar intake  [  Encouraged Heart-Healthy, Diabetic-Friendly, Low Sodium, Reduced Fat Diet.]  Pharmacy Interventions   Pharmacy Dicussed/Reviewed Pharmacy Topics Discussed, Medications and their functions, Medication Adherence, Pharmacy Topics Reviewed, Affording Medications  [Confirmed Ability to Afford Prescription Medications.]  Medication Adherence --  [Confirmed Compliance with Prescription Medications.]  Safety Interventions   Safety Discussed/Reviewed Safety Discussed, Safety Reviewed, Fall Risk, Home Safety  [Encouraged Routine Use of Home Assistive Devices & Durable Medical Equipment.]  Home Safety Assistive Devices, Need for home safety assessment, Contact provider for referral to PT/OT, Refer for community resources  [Encouraged Consideration of Home Safety Evaluation & Referral to Home Health Agency of Choice for Physical & Occupational Therapy Services.]  Advanced Directive Interventions   Advanced Directives Discussed/Reviewed Advanced Directives Discussed, Advanced Directives Reviewed, Advanced Care Planning, Guardianship  [Encouraged Initiation of Advanced Directives (Living Will & Healthcare Power of Corporate treasurer), Offering to [employer], Assist with Completion, Make Copies & Scan into Electronic Medical Record in Epic.]  Guardianship Provide resources, Refer to an agency  [Provided Wife, Phyllis Whitefield with Resources.]      Active Listening & Reflection Utilized.  Verbalization of Feelings Encouraged.  Emotional Support Provided. CSW Collaboration with Wife, Norman Piacentini to  Confirm an Increase in All of The Following Symptoms: ~ Hallucinations. ~ Rage. ~ Argumentative. ~ Accusatory (Infidelity). ~ Paranoia. ~ Inappropriate Interactions & Conversations with Minors. ~ Rude & Disrespectful to Family Members & Strangers. ~ Grover Canavan. ~ Significant Memory Loss/Deficits. CSW Collaboration with Wife, [place] Skalicky to Control and instrumentation engineer with Agencies of Interest in [place], from List Provided: ~ Adult Day Care Programs  ~ In-Home Care & Respite Agencies ~ Home Health Care Agencies ~ Respite Care Agencies & Facilities ~ Theatre manager Providers CSW Collaboration with Wife, Lauri Till to Encourage Routine Engagement with [name], Licensed Clinical Social Worker with [employer] [phone]), if She Has Questions, Needs Assistance, or If Additional Social Work Needs Are Identified Between Now & Our Next Follow-Up Outreach Call, Scheduled on 02/16/2024 at 9:00 AM.      Our next appointment is by telephone on 02/16/2024 at 9:00 am.  Please call the care guide team at [phone] if you need to cancel or reschedule your appointment.   If you are experiencing a Mental Health or Behavioral Health Crisis or need someone to talk to, please call the Suicide and Crisis Lifeline: 988 call the [place] National Suicide Prevention Lifeline: 409-375-1386 or TTY: 435-277-9619 TTY 7150336106) to talk to a trained counselor call 1-800-273-TALK (toll free, 24 hour hotline) go to [facility] [address] [place] [phone]) call the [facility] Crisis Line: (762)279-3266 call 911  Patient verbalizes understanding of instructions and care plan provided today and agrees to view in MyChart. Active MyChart status and patient understanding of how to access instructions and care plan via MyChart confirmed with patient.     Telephone follow up appointment with care management  team member scheduled for:   02/16/2024 at 9:00 am.   [name], BSW, MSW, LCSW [place]  [facility], [facility] Clinical Social Worker II Direct Dial: [phone]  Fax: [phone] Website: [email or web address]

## 2024-01-19 NOTE — Patient Outreach (Signed)
Care Coordination   Follow Up Visit Note   01/19/2024  Name: Blake Lawson MRN: [id number] DOB: May 11, 1935  Blake Lawson is a 89 y.o. year old male who sees Blake Peri, MD for primary care. I spoke with patient's wife, Blake Lawson by phone today.  What matters to the patients health and wellness today?  Provide Engineer, technical sales, Support, Museum/gallery conservator.    Goals Addressed             This Visit's Progress    Provide Engineer, technical sales, Support, Agencies & Resources.   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, Caregiver Burnout, Stress & Fatigue, Requires Assistance with Activities of Daily Living.]  General Interventions   General Interventions Discussed/Reviewed General Interventions Discussed, Labs, Vaccines, Doctor Visits, General Interventions Reviewed, Annual Eye Exam, Level of Care, [employer], Horticulturist, commercial (DME), Lipid Profile, Annual Foot Exam, Health Screening, Communication with  [Encouraged Routine Engagement with Care Team Members.]  Labs Hgb A1c every 3 months, Hgb A1c annually  [Encouraged Routine Blood Sugar Monitoring.]  Vaccines COVID-19, Flu, Pneumonia, RSV, Shingles, Tetanus/Pertussis/Diphtheria  [Encouraged Annual Vaccinations.]  Doctor Visits Discussed/Reviewed Doctor Visits Discussed, Specialist, Doctor Visits Reviewed, Annual Wellness Visits, PCP  [Encouraged Routine Engagement with Care Team Members.]  Health Screening Bone Density, Colonoscopy, Prostate  [Encouraged Annual Health Screenings.]  Durable Medical Equipment (DME) Glucomoter, Other, BP Cuff, Environmental consultant, Wheelchair  [Blood Pressure Cuff, [place], Scales, Prescription  Eyeglasses, Engineer, materials in Air traffic controller, Psychologist, prison and probation services Hose.]  Wheelchair Standard  PCP/Specialist Visits Compliance with follow-up visit  Communication with PCP/Specialists, Charity fundraiser, Pharmacists, Social Work  [employer] Routine Engagement with Care Team Members.]  Level of Care Adult Daycare, Air traffic controller, Assisted Living, Skilled Nursing Facility  [Confirmed Disinterest in Enrollment in Adult Day Care Program or Receiving Assistance Pursuing Higher Level of Care Placement Options (I.e Assisted Living Versus Skilled Nursing Facility).]  Applications Medicaid, Personal Care Services  [Confirmed Interest in Applying for Medicaid & Personal Care Services, Mailing Applications & Offering Assistance with Completion & Submission.]  Exercise Interventions   Exercise Discussed/Reviewed Exercise Discussed, Assistive device use and maintanence, Exercise Reviewed, Physical Activity, Weight Managment  [Encouraged Increased Level of Activity & Exercise, Inside & Outside the Home.]  Physical Activity Discussed/Reviewed Physical Activity Discussed, Home Exercise Program (HEP), PREP, Physical Activity Reviewed, Gym, Types of exercise  [Encouraged Daily Exercise Regimen, as Tolerated.]  Weight Management Weight loss  [Encouraged Healthy Weight Loss Program.]  Education Interventions   Education Provided Provided Therapist, sports, Provided Web-based Education, Provided Scientist, research (medical), Human resources officer Reviewed to Entertain Questions & Ensure Understanding.]  Provided Verbal Education On Nutrition, Foot Care, Eye Care, Labs, Blood Sugar Monitoring, Mental Health/Coping with Illness, When to see the doctor, Applications, Exercise, Medication, Development worker, community, [employer]  [employer] Consideration & Implementation of Educational Material.]  Labs Reviewed Hgb A1c  [Reviewed with Wife, Engineer, materials, Personal Care Services  [employer] Interest in Applying for [employer] &  Personal Care Services, Teaching laboratory technician & Offering Assistance with Completion & Submission.]  Mental Health Interventions   Mental Health Discussed/Reviewed Mental Health Discussed, Anxiety, Depression, Mental Health Reviewed, Grief and Loss, Substance Abuse, Coping Strategies, Suicide, Crisis, Other  [Assessed Mental Health & Cognitive  Status.]  Nutrition Interventions   Nutrition Discussed/Reviewed Nutrition Discussed, Adding fruits and vegetables, Increasing proteins, Decreasing fats, Nutrition Reviewed, Fluid intake, Decreasing salt, Portion sizes, Carbohydrate meal planning, Decreasing sugar intake  [Encouraged Heart-Healthy, Diabetic-Friendly, Low Sodium, Reduced Fat Diet.]  Pharmacy Interventions   Pharmacy Dicussed/Reviewed Pharmacy Topics Discussed, Medications and their functions, Medication Adherence, Pharmacy Topics Reviewed, Affording Medications  [Confirmed Ability to Afford Prescription Medications.]  Medication Adherence --  [Confirmed Compliance with Prescription Medications.]  Safety Interventions   Safety Discussed/Reviewed Safety Discussed, Safety Reviewed, Fall Risk, Home Safety  [Encouraged Routine Use of Home Assistive Devices & Durable Medical Equipment.]  Home Safety Assistive Devices, Need for home safety assessment, Contact provider for referral to PT/OT, Refer for community resources  [Encouraged Consideration of Home Safety Evaluation & Referral to Home Health Agency of Choice for Physical & Occupational Therapy Services.]  Advanced Directive Interventions   Advanced Directives Discussed/Reviewed Advanced Directives Discussed, Advanced Directives Reviewed, Advanced Care Planning, Guardianship  [Encouraged Initiation of Advanced Directives (Living Will & Healthcare Power of Corporate treasurer), Offering to [employer], Assist with Completion, Make Copies & Scan into Electronic Medical Record in Epic.]  Guardianship Provide resources, Refer to an agency  [Provided Wife,  Blake Lawson with Resources.]      Active Listening & Reflection Utilized.  Verbalization of Feelings Encouraged.  Emotional Support Provided. CSW Collaboration with Wife, Antonin Meininger to Confirm an Increase in All of The Following Symptoms: ~ Hallucinations. ~ Rage. ~ Argumentative. ~ Accusatory (Infidelity). ~ Paranoia. ~ Inappropriate Interactions & Conversations with Minors. ~ Rude & Disrespectful to Family Members & Strangers. ~ Grover Canavan. ~ Significant Memory Loss/Deficits. CSW Collaboration with Wife, Noor Vidales to Control and instrumentation engineer with Agencies of Interest in [place], from List Provided: ~ Adult Day Care Programs  ~ In-Home Care & Respite Agencies ~ Home Health Care Agencies ~ Respite Care Agencies & Facilities ~ Theatre manager Providers CSW Collaboration with Wife, Maalik Pinn to Encourage Routine Engagement with [name], Licensed Clinical Social Worker with [facility] [phone]), if She Has Questions, Needs Assistance, or If Additional Social Work Needs Are Identified Between Now & Our Next Follow-Up Outreach Call, Scheduled on 02/16/2024 at 9:00 AM.      SDOH assessments and interventions completed:  Yes.  Care Coordination Interventions:  Yes, provided.   Follow up plan: Follow up call scheduled for 02/16/2024 at 9:00 am.  Encounter Outcome:  Patient Visit Completed.   [name], BSW, MSW, LCSW [facility], [facility] Clinical Social Worker II Direct Dial: [phone]  Fax: [phone] Website: [email or web address]

## 2024-01-22 DIAGNOSIS — D320 Benign neoplasm of cerebral meninges: Secondary | ICD-10-CM | POA: Diagnosis not present

## 2024-01-22 DIAGNOSIS — J320 Chronic maxillary sinusitis: Secondary | ICD-10-CM | POA: Diagnosis not present

## 2024-01-22 DIAGNOSIS — H9319 Tinnitus, unspecified ear: Secondary | ICD-10-CM | POA: Diagnosis not present

## 2024-01-25 ENCOUNTER — Encounter: Payer: Self-pay | Admitting: *Deleted

## 2024-01-25 ENCOUNTER — Telehealth: Payer: Self-pay | Admitting: *Deleted

## 2024-01-25 DIAGNOSIS — I10 Essential (primary) hypertension: Secondary | ICD-10-CM | POA: Diagnosis not present

## 2024-01-25 DIAGNOSIS — H6001 Abscess of right external ear: Secondary | ICD-10-CM | POA: Diagnosis not present

## 2024-01-25 DIAGNOSIS — Z299 Encounter for prophylactic measures, unspecified: Secondary | ICD-10-CM | POA: Diagnosis not present

## 2024-01-25 NOTE — Progress Notes (Signed)
Complex Care Management Care Guide Note  01/25/2024 Name: [name] MRN: [id number] DOB: 30-May-1935  Blake Lawson is a 89 y.o. year old male who is a primary care patient of [name], MD and is actively engaged with the care management team. I reached out to [name] by phone today to assist with re-scheduling  with the RN Case Manager.  Follow up plan: Telephone appointment with complex care management team member scheduled for:  02/27/24  Gwenevere Ghazi  [facility]  [facility], [facility] Guide  Direct Dial: [phone]  Fax [phone]

## 2024-02-01 DIAGNOSIS — E1169 Type 2 diabetes mellitus with other specified complication: Secondary | ICD-10-CM | POA: Diagnosis not present

## 2024-02-01 DIAGNOSIS — Z299 Encounter for prophylactic measures, unspecified: Secondary | ICD-10-CM | POA: Diagnosis not present

## 2024-02-01 DIAGNOSIS — I10 Essential (primary) hypertension: Secondary | ICD-10-CM | POA: Diagnosis not present

## 2024-02-01 DIAGNOSIS — R443 Hallucinations, unspecified: Secondary | ICD-10-CM | POA: Diagnosis not present

## 2024-02-01 DIAGNOSIS — D320 Benign neoplasm of cerebral meninges: Secondary | ICD-10-CM | POA: Diagnosis not present

## 2024-02-06 DIAGNOSIS — D320 Benign neoplasm of cerebral meninges: Secondary | ICD-10-CM | POA: Diagnosis not present

## 2024-02-06 DIAGNOSIS — Z923 Personal history of irradiation: Secondary | ICD-10-CM | POA: Diagnosis not present

## 2024-02-12 NOTE — Progress Notes (Deleted)
 [facility] OF [place]  ASSESSMENT / PLAN: Blake Lawson is a 89 y.o. male with atherosclerosis of native arteries of left lower extremity causing ulceration.  Recommend:  Abstinence from all tobacco products. Blood glucose control with goal A1c < 7%. Blood pressure control with goal blood pressure < 140/90 mmHg. Lipid reduction therapy with goal LDL-C <100 mg/dL  Eliquis 5mg  PO BID Atorvastatin 40-80mg  PO QD (or other "high intensity" statin therapy).  Left great toe ulcer appears fairly benign.  Toe pressures are reassuring.  I suspect he will heal without intervention.  I will see him again in 2 months to check the foot  CHIEF COMPLAINT: left great toe ulcer  HISTORY OF PRESENT ILLNESS: Blake Lawson is a 89 y.o. male for to clinic for evaluation of left great toe ulcer.  The patient is followed by Dr. Jenene Slicker who noted a ulcer on his left great toe earlier this month.  A CT angiogram was ordered to evaluate lower extremity arteries.  This showed multiple issues (see below).  Patient was referred for further management.  On my evaluation, the patient is a very pleasant elderly gentleman who is minimally ambulatory.  He is with his daughter today.  The patient has been having trouble with ambulating, and has suffered frequent falls.  The ulcer on his left great toe developed during a period of significant swelling in his lower extremities.  This is much improved with starting Lasix and using compression garments.  Ulcer has been present for about 3 weeks.  Patient does not walk enough to claudicate.  He does not describe rest pain.  12/12/23: patient returns with his daughter. Doing well. He feels the wound is healing slowly.   VASCULAR SURGICAL HISTORY: none  VASCULAR RISK FACTORS: Positive history of stroke / transient ischemic attack. Negative history of coronary artery disease.  Positive history of diabetes mellitus. Positive history of smoking. Not  actively smoking. Positive history of hypertension.  Negative history of chronic kidney disease.  Negative history of chronic obstructive pulmonary disease.  FUNCTIONAL STATUS: ECOG performance status: (2) Ambulatory and capable of self care, unable to carry out work activity, up and about > 50% or waking hours Ambulatory status: Minimally ambulatory (e.g. 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    Past Surgical History:  Procedure Laterality Date   APPLICATION OF CRANIAL NAVIGATION Right 09/10/2021   Procedure: APPLICATION OF CRANIAL NAVIGATION;  Surgeon: Lisbeth Renshaw, MD;  Location: [facility] OR;  Service: Neurosurgery;  Laterality: Right;   CHOLECYSTECTOMY     CRANIOTOMY Right 09/10/2021   Procedure: 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;  Surgeon: Lisbeth Renshaw, MD;  Location: [facility] OR;  Service: Neurosurgery;  Laterality: Right;   TRANSURETHRAL RESECTION OF PROSTATE      Family History  Problem  Relation Age of Onset   Hypertension Mother    Diabetes Mother    Diabetes Father     Social History   Socioeconomic History   Marital status: Married    Spouse name: Mehdi Gironda   Number of children: Not on file   Years of education: 12   Highest education level: 12th grade  Occupational History   Not on file  Tobacco Use   Smoking status: Former    Types: Cigarettes     Passive exposure: Past   Smokeless tobacco: Never  Vaping Use   Vaping status: Never Used  Substance and Sexual Activity   Alcohol use: Not Currently    Comment: occasional   Drug use: Never   Sexual activity: Not Currently    Partners: Female  Other Topics Concern   Not on file  Social History Narrative   Not on file   Social Drivers of Health   Financial Resource Strain: Low Risk  (12/18/2023)   Overall Financial Resource Strain (CARDIA)    Difficulty of Paying Living Expenses: Not hard at all  Food Insecurity: No Food Insecurity (12/18/2023)   Hunger Vital Sign    Worried About Running Out of Food in the Last Year: Never true    Ran Out of Food in the Last Year: Never true  Transportation Needs: No Transportation Needs (01/11/2024)   PRAPARE - Administrator, Civil Service (Medical): No    Lack of Transportation (Non-Medical): No  Physical Activity: Inactive (12/18/2023)   Exercise Vital Sign    Days of Exercise per Week: 0 days    Minutes of Exercise per Session: 0 min  Stress: No Stress Concern Present (12/18/2023)   [employer] of Occupational Health - Occupational Stress Questionnaire    Feeling of Stress : Not at all  Social Connections: Moderately Integrated (12/18/2023)   Social Connection and Isolation Panel [NHANES]    Frequency of Communication with Friends and Family: More than three times a week    Frequency of Social Gatherings with Friends and Family: More than three times a week    Attends Religious Services: More than 4 times per year    Active Member of [employer] or Organizations: No    Attends Banker Meetings: Never    Marital Status: Married  Catering manager Violence: Not At Risk (12/18/2023)   Humiliation, Afraid, Rape, and Kick questionnaire    Fear of Current or Ex-Partner: No    Emotionally Abused: No    Physically Abused: No    Sexually Abused: No    Allergies  Allergen Reactions   Hydrocodone Other (See  Comments)    Current Outpatient Medications  Medication Sig Dispense Refill   albuterol (VENTOLIN HFA) 108 (90 Base) MCG/ACT inhaler Inhale 2 puffs into the lungs every 6 (six) hours as needed for wheezing or shortness of breath.     amLODipine (NORVASC) 5 MG tablet Take 5 mg by mouth daily.     apixaban (ELIQUIS) 5 MG TABS tablet Take 1 tablet (5 mg total) by mouth 2 (two) times daily. 60 tablet    atorvastatin (LIPITOR) 40 MG tablet Take 1 tablet (40 mg total) by mouth daily. 30 tablet 3   BD VEO INSULIN SYRINGE U/F 31G X 15/64" 1 ML MISC 2 (two) times daily.     clotrimazole (LOTRIMIN) 1 % cream Apply 1 application  topically daily.     empagliflozin (JARDIANCE) 25 MG TABS tablet Take 25 mg by mouth daily.  fluticasone (FLONASE) 50 MCG/ACT nasal spray Place 1 spray into both nostrils daily.     furosemide (LASIX) 20 MG tablet Take 20 mg by mouth daily.     insulin NPH-regular Human (HUMULIN 70/30) (70-30) 100 UNIT/ML injection Inject 25-35 Units into the skin 2 (two) times daily with a meal. Per sliding Scale     latanoprost (XALATAN) 0.005 % ophthalmic solution Place 1 drop into both eyes at bedtime.     levocetirizine (XYZAL) 5 MG tablet 5 mg as needed.     losartan (COZAAR) 100 MG tablet Take 100 mg by mouth daily.     Magnesium 400 MG TABS Take 400 mg by mouth daily.     metFORMIN (GLUCOPHAGE) 500 MG tablet Take 1,000 mg by mouth 2 (two) times daily with a meal.     metoprolol succinate (TOPROL-XL) 50 MG 24 hr tablet Take 25 mg by mouth daily.     No current facility-administered medications for this visit.    PHYSICAL EXAM There were no vitals filed for this visit.   No distress Regular rate and rhythm Unlabored breathing No palpable pedal pulses Left great toe ulceration appears improved. There ulcer measures about the size of a pencil eraser.    PERTINENT LABORATORY AND RADIOLOGIC DATA  Most recent CBC    Latest Ref Rng & Units 09/08/2021   10:36 AM  CBC  WBC  4.0 - 10.5 K/uL 6.3   Hemoglobin 13.0 - 17.0 g/dL 16.1   Hematocrit 09.6 - 52.0 % 45.9   Platelets 150 - 400 K/uL 195      Most recent CMP    Latest Ref Rng & Units 11/10/2023   10:07 AM 09/08/2021   10:36 AM  CMP  Glucose 70 - 99 mg/dL  045   BUN 8 - 23 mg/dL  18   Creatinine 4.09 - 1.24 mg/dL 8.11  9.14   Sodium 782 - 145 mmol/L  141   Potassium 3.5 - 5.1 mmol/L  4.0   Chloride 98 - 111 mmol/L  107   CO2 22 - 32 mmol/L  27   Calcium 8.9 - 10.3 mg/dL  9.4     Renal function CrCl cannot be calculated (Patient's most recent lab result is older than the maximum 21 days allowed.).  Hemoglobin A1C (no units)  Date Value  04/14/2023 7.5   CT angiogram of bilateral lower extremities. No formal read available yet. On my evaluation, there is multifocal disease on the left. The left distal common iliac and proximal external iliac have severe stenosis. There is a "flush" appearing occlusion of the left SFA. The popliteal artery reconstitutes behind the knee but is heavily diseased. The trifurcation appears patent proximally. Distally there is insufficient contrast to evaluate the distal tibial / pedal circulation.    Rande Brunt. Lenell Antu, MD [facility] Phone Number: [phone] 02/12/2024 3:59 PM   Total time spent on preparing this encounter including chart review, data review, collecting history, examining the patient, coordinating care for this established patient, 20 minutes.   Portions of this report may have been transcribed using voice recognition software.  Every effort has been made to ensure accuracy; however, inadvertent computerized transcription errors may still be present.

## 2024-02-13 ENCOUNTER — Ambulatory Visit: Payer: Medicare PPO | Admitting: Vascular Surgery

## 2024-02-16 ENCOUNTER — Encounter: Payer: Self-pay | Admitting: *Deleted

## 2024-02-16 ENCOUNTER — Ambulatory Visit: Payer: Self-pay | Admitting: *Deleted
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
## 2024-02-20 DIAGNOSIS — R2689 Other abnormalities of gait and mobility: Secondary | ICD-10-CM | POA: Diagnosis not present

## 2024-02-20 DIAGNOSIS — R531 Weakness: Secondary | ICD-10-CM | POA: Diagnosis not present

## 2024-02-20 DIAGNOSIS — F29 Unspecified psychosis not due to a substance or known physiological condition: Secondary | ICD-10-CM | POA: Diagnosis not present

## 2024-02-20 DIAGNOSIS — Z79899 Other long term (current) drug therapy: Secondary | ICD-10-CM | POA: Diagnosis not present

## 2024-02-20 DIAGNOSIS — G9389 Other specified disorders of brain: Secondary | ICD-10-CM | POA: Diagnosis not present

## 2024-02-20 DIAGNOSIS — F03918 Unspecified dementia, unspecified severity, with other behavioral disturbance: Secondary | ICD-10-CM | POA: Diagnosis not present

## 2024-02-20 DIAGNOSIS — E785 Hyperlipidemia, unspecified: Secondary | ICD-10-CM | POA: Diagnosis not present

## 2024-02-20 DIAGNOSIS — N289 Disorder of kidney and ureter, unspecified: Secondary | ICD-10-CM | POA: Diagnosis not present

## 2024-02-20 DIAGNOSIS — R4182 Altered mental status, unspecified: Secondary | ICD-10-CM | POA: Diagnosis not present

## 2024-02-20 DIAGNOSIS — F05 Delirium due to known physiological condition: Secondary | ICD-10-CM | POA: Diagnosis not present

## 2024-02-20 DIAGNOSIS — G319 Degenerative disease of nervous system, unspecified: Secondary | ICD-10-CM | POA: Diagnosis not present

## 2024-02-20 DIAGNOSIS — Z7901 Long term (current) use of anticoagulants: Secondary | ICD-10-CM | POA: Diagnosis not present

## 2024-02-20 DIAGNOSIS — I6782 Cerebral ischemia: Secondary | ICD-10-CM | POA: Diagnosis not present

## 2024-02-20 DIAGNOSIS — R451 Restlessness and agitation: Secondary | ICD-10-CM | POA: Diagnosis not present

## 2024-02-20 DIAGNOSIS — Z7984 Long term (current) use of oral hypoglycemic drugs: Secondary | ICD-10-CM | POA: Diagnosis not present

## 2024-02-20 DIAGNOSIS — E119 Type 2 diabetes mellitus without complications: Secondary | ICD-10-CM | POA: Diagnosis not present

## 2024-02-20 DIAGNOSIS — R443 Hallucinations, unspecified: Secondary | ICD-10-CM | POA: Diagnosis not present

## 2024-02-23 ENCOUNTER — Ambulatory Visit: Payer: Self-pay | Admitting: *Deleted
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
## 2024-02-25 DIAGNOSIS — F29 Unspecified psychosis not due to a substance or known physiological condition: Secondary | ICD-10-CM | POA: Diagnosis not present

## 2024-02-25 DIAGNOSIS — R451 Restlessness and agitation: Secondary | ICD-10-CM | POA: Diagnosis not present

## 2024-02-27 ENCOUNTER — Telehealth: Payer: Self-pay | Admitting: *Deleted

## 2024-02-27 ENCOUNTER — Encounter: Payer: Self-pay | Admitting: *Deleted

## 2024-02-27 NOTE — Progress Notes (Signed)
 Complex Care Management Care Guide Note  02/27/2024 Name: Blake Lawson MRN: [id number] DOB: [birth year]  Blake Lawson is a 89 y.o. year old male who is a primary care patient of [name], MD and is actively engaged with the care management team. I reached out to [name] by phone today to assist with re-scheduling  with the RN Case Manager.  Follow up plan: Patient is in hospital with no anticipated plan of discharge at this time per [facility].    Gwenevere Ghazi  [facility]  [facility], [facility] Guide  Direct Dial: [phone]  Fax [phone]

## 2024-03-01 DIAGNOSIS — D509 Iron deficiency anemia, unspecified: Secondary | ICD-10-CM | POA: Diagnosis not present

## 2024-03-01 DIAGNOSIS — Z79890 Hormone replacement therapy (postmenopausal): Secondary | ICD-10-CM | POA: Diagnosis not present

## 2024-03-01 DIAGNOSIS — N179 Acute kidney failure, unspecified: Secondary | ICD-10-CM | POA: Diagnosis not present

## 2024-03-01 DIAGNOSIS — R4587 Impulsiveness: Secondary | ICD-10-CM | POA: Diagnosis not present

## 2024-03-01 DIAGNOSIS — R918 Other nonspecific abnormal finding of lung field: Secondary | ICD-10-CM | POA: Diagnosis not present

## 2024-03-01 DIAGNOSIS — Z79899 Other long term (current) drug therapy: Secondary | ICD-10-CM | POA: Diagnosis not present

## 2024-03-01 DIAGNOSIS — G3183 Dementia with Lewy bodies: Secondary | ICD-10-CM | POA: Diagnosis not present

## 2024-03-01 DIAGNOSIS — R451 Restlessness and agitation: Secondary | ICD-10-CM | POA: Diagnosis not present

## 2024-03-01 DIAGNOSIS — F23 Brief psychotic disorder: Secondary | ICD-10-CM | POA: Diagnosis not present

## 2024-03-01 DIAGNOSIS — F02811 Dementia in other diseases classified elsewhere, unspecified severity, with agitation: Secondary | ICD-10-CM | POA: Diagnosis not present

## 2024-03-01 DIAGNOSIS — I10 Essential (primary) hypertension: Secondary | ICD-10-CM | POA: Diagnosis not present

## 2024-03-01 DIAGNOSIS — M19071 Primary osteoarthritis, right ankle and foot: Secondary | ICD-10-CM | POA: Diagnosis not present

## 2024-03-01 DIAGNOSIS — E875 Hyperkalemia: Secondary | ICD-10-CM | POA: Diagnosis not present

## 2024-03-01 DIAGNOSIS — R079 Chest pain, unspecified: Secondary | ICD-10-CM | POA: Diagnosis not present

## 2024-03-01 DIAGNOSIS — E785 Hyperlipidemia, unspecified: Secondary | ICD-10-CM | POA: Diagnosis not present

## 2024-03-01 DIAGNOSIS — E860 Dehydration: Secondary | ICD-10-CM | POA: Diagnosis not present

## 2024-03-01 DIAGNOSIS — E119 Type 2 diabetes mellitus without complications: Secondary | ICD-10-CM | POA: Diagnosis not present

## 2024-03-01 DIAGNOSIS — F29 Unspecified psychosis not due to a substance or known physiological condition: Secondary | ICD-10-CM | POA: Diagnosis not present

## 2024-03-01 DIAGNOSIS — F05 Delirium due to known physiological condition: Secondary | ICD-10-CM | POA: Diagnosis not present

## 2024-03-03 DIAGNOSIS — F05 Delirium due to known physiological condition: Secondary | ICD-10-CM | POA: Diagnosis not present

## 2024-03-03 DIAGNOSIS — F29 Unspecified psychosis not due to a substance or known physiological condition: Secondary | ICD-10-CM | POA: Diagnosis not present

## 2024-03-03 DIAGNOSIS — R4587 Impulsiveness: Secondary | ICD-10-CM | POA: Diagnosis not present

## 2024-03-03 DIAGNOSIS — R451 Restlessness and agitation: Secondary | ICD-10-CM | POA: Diagnosis not present

## 2024-03-06 NOTE — Telephone Encounter (Signed)
 Staff message sent to [name], LCSW regarding upcoming telephone appointment on 03/11/24. Requested that she schedule a follow-up appointment with [facility] or communicate with RNCM if patient/wife no longer needs RN Care Management services.   Demetrios Loll, RN, BSN [place]  [facility], [facility] RN Care Manager Direct Dial: [phone]

## 2024-03-11 ENCOUNTER — Ambulatory Visit: Payer: Self-pay | Admitting: *Deleted
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
## 2024-03-15 ENCOUNTER — Encounter: Payer: Medicare PPO | Admitting: *Deleted

## 2024-03-19 DIAGNOSIS — Z299 Encounter for prophylactic measures, unspecified: Secondary | ICD-10-CM | POA: Diagnosis not present

## 2024-03-19 DIAGNOSIS — E1169 Type 2 diabetes mellitus with other specified complication: Secondary | ICD-10-CM | POA: Diagnosis not present

## 2024-03-19 DIAGNOSIS — R443 Hallucinations, unspecified: Secondary | ICD-10-CM | POA: Diagnosis not present

## 2024-03-19 DIAGNOSIS — Z794 Long term (current) use of insulin: Secondary | ICD-10-CM | POA: Diagnosis not present

## 2024-03-19 DIAGNOSIS — I10 Essential (primary) hypertension: Secondary | ICD-10-CM | POA: Diagnosis not present

## 2024-04-01 DIAGNOSIS — R630 Anorexia: Secondary | ICD-10-CM | POA: Diagnosis not present

## 2024-04-01 DIAGNOSIS — R5383 Other fatigue: Secondary | ICD-10-CM | POA: Diagnosis not present

## 2024-04-01 DIAGNOSIS — R634 Abnormal weight loss: Secondary | ICD-10-CM | POA: Diagnosis not present

## 2024-04-01 DIAGNOSIS — Z515 Encounter for palliative care: Secondary | ICD-10-CM | POA: Diagnosis not present

## 2024-04-01 DIAGNOSIS — D420 Neoplasm of uncertain behavior of cerebral meninges: Secondary | ICD-10-CM | POA: Diagnosis not present

## 2024-04-30 DIAGNOSIS — R443 Hallucinations, unspecified: Secondary | ICD-10-CM | POA: Diagnosis not present

## 2024-04-30 DIAGNOSIS — E1169 Type 2 diabetes mellitus with other specified complication: Secondary | ICD-10-CM | POA: Diagnosis not present

## 2024-04-30 DIAGNOSIS — F23 Brief psychotic disorder: Secondary | ICD-10-CM | POA: Diagnosis not present

## 2024-04-30 DIAGNOSIS — I739 Peripheral vascular disease, unspecified: Secondary | ICD-10-CM | POA: Diagnosis not present

## 2024-04-30 DIAGNOSIS — Z299 Encounter for prophylactic measures, unspecified: Secondary | ICD-10-CM | POA: Diagnosis not present

## 2024-04-30 DIAGNOSIS — I10 Essential (primary) hypertension: Secondary | ICD-10-CM | POA: Diagnosis not present

## 2024-04-30 DIAGNOSIS — Z794 Long term (current) use of insulin: Secondary | ICD-10-CM | POA: Diagnosis not present

## 2024-05-02 DIAGNOSIS — D320 Benign neoplasm of cerebral meninges: Secondary | ICD-10-CM | POA: Diagnosis not present

## 2024-05-02 DIAGNOSIS — G9389 Other specified disorders of brain: Secondary | ICD-10-CM | POA: Diagnosis not present

## 2024-05-02 DIAGNOSIS — Z923 Personal history of irradiation: Secondary | ICD-10-CM | POA: Diagnosis not present

## 2024-05-06 DIAGNOSIS — R001 Bradycardia, unspecified: Secondary | ICD-10-CM | POA: Diagnosis not present

## 2024-05-06 DIAGNOSIS — F02818 Dementia in other diseases classified elsewhere, unspecified severity, with other behavioral disturbance: Secondary | ICD-10-CM | POA: Diagnosis not present

## 2024-05-06 DIAGNOSIS — R5383 Other fatigue: Secondary | ICD-10-CM | POA: Diagnosis not present

## 2024-05-06 DIAGNOSIS — F02811 Dementia in other diseases classified elsewhere, unspecified severity, with agitation: Secondary | ICD-10-CM | POA: Diagnosis not present

## 2024-05-06 DIAGNOSIS — G3183 Dementia with Lewy bodies: Secondary | ICD-10-CM | POA: Diagnosis not present

## 2024-05-06 DIAGNOSIS — R630 Anorexia: Secondary | ICD-10-CM | POA: Diagnosis not present

## 2024-05-06 DIAGNOSIS — Z515 Encounter for palliative care: Secondary | ICD-10-CM | POA: Diagnosis not present

## 2024-05-06 DIAGNOSIS — D420 Neoplasm of uncertain behavior of cerebral meninges: Secondary | ICD-10-CM | POA: Diagnosis not present

## 2024-05-07 DIAGNOSIS — Z923 Personal history of irradiation: Secondary | ICD-10-CM | POA: Diagnosis not present

## 2024-05-07 DIAGNOSIS — D320 Benign neoplasm of cerebral meninges: Secondary | ICD-10-CM | POA: Diagnosis not present

## 2024-05-07 DIAGNOSIS — R410 Disorientation, unspecified: Secondary | ICD-10-CM | POA: Diagnosis not present

## 2024-05-07 DIAGNOSIS — G3183 Dementia with Lewy bodies: Secondary | ICD-10-CM | POA: Diagnosis not present

## 2024-05-07 DIAGNOSIS — F0280 Dementia in other diseases classified elsewhere without behavioral disturbance: Secondary | ICD-10-CM | POA: Diagnosis not present

## 2024-05-07 DIAGNOSIS — D497 Neoplasm of unspecified behavior of endocrine glands and other parts of nervous system: Secondary | ICD-10-CM | POA: Diagnosis not present

## 2024-05-07 DIAGNOSIS — R9089 Other abnormal findings on diagnostic imaging of central nervous system: Secondary | ICD-10-CM | POA: Diagnosis not present

## 2024-05-24 ENCOUNTER — Other Ambulatory Visit: Payer: Self-pay | Admitting: Internal Medicine

## 2024-05-26 DIAGNOSIS — Z885 Allergy status to narcotic agent status: Secondary | ICD-10-CM | POA: Diagnosis not present

## 2024-05-26 DIAGNOSIS — F29 Unspecified psychosis not due to a substance or known physiological condition: Secondary | ICD-10-CM | POA: Diagnosis not present

## 2024-05-26 DIAGNOSIS — Z7984 Long term (current) use of oral hypoglycemic drugs: Secondary | ICD-10-CM | POA: Diagnosis not present

## 2024-05-26 DIAGNOSIS — E119 Type 2 diabetes mellitus without complications: Secondary | ICD-10-CM | POA: Diagnosis not present

## 2024-05-26 DIAGNOSIS — D509 Iron deficiency anemia, unspecified: Secondary | ICD-10-CM | POA: Diagnosis not present

## 2024-05-26 DIAGNOSIS — F918 Other conduct disorders: Secondary | ICD-10-CM | POA: Diagnosis not present

## 2024-05-26 DIAGNOSIS — I10 Essential (primary) hypertension: Secondary | ICD-10-CM | POA: Diagnosis not present

## 2024-05-26 DIAGNOSIS — E1151 Type 2 diabetes mellitus with diabetic peripheral angiopathy without gangrene: Secondary | ICD-10-CM | POA: Diagnosis not present

## 2024-05-26 DIAGNOSIS — E785 Hyperlipidemia, unspecified: Secondary | ICD-10-CM | POA: Diagnosis not present

## 2024-05-26 DIAGNOSIS — R4689 Other symptoms and signs involving appearance and behavior: Secondary | ICD-10-CM | POA: Diagnosis not present

## 2024-05-26 DIAGNOSIS — F05 Delirium due to known physiological condition: Secondary | ICD-10-CM | POA: Diagnosis not present

## 2024-05-26 DIAGNOSIS — F0390 Unspecified dementia without behavioral disturbance: Secondary | ICD-10-CM | POA: Diagnosis not present

## 2024-05-26 DIAGNOSIS — I6930 Unspecified sequelae of cerebral infarction: Secondary | ICD-10-CM | POA: Diagnosis not present

## 2024-05-26 DIAGNOSIS — G3183 Dementia with Lewy bodies: Secondary | ICD-10-CM | POA: Diagnosis not present

## 2024-05-26 DIAGNOSIS — R059 Cough, unspecified: Secondary | ICD-10-CM | POA: Diagnosis not present

## 2024-05-26 DIAGNOSIS — Z794 Long term (current) use of insulin: Secondary | ICD-10-CM | POA: Diagnosis not present

## 2024-05-26 DIAGNOSIS — R4182 Altered mental status, unspecified: Secondary | ICD-10-CM | POA: Diagnosis not present

## 2024-05-26 DIAGNOSIS — F02818 Dementia in other diseases classified elsewhere, unspecified severity, with other behavioral disturbance: Secondary | ICD-10-CM | POA: Diagnosis not present

## 2024-05-30 DIAGNOSIS — F02818 Dementia in other diseases classified elsewhere, unspecified severity, with other behavioral disturbance: Secondary | ICD-10-CM | POA: Diagnosis not present

## 2024-05-30 DIAGNOSIS — E119 Type 2 diabetes mellitus without complications: Secondary | ICD-10-CM | POA: Diagnosis not present

## 2024-05-30 DIAGNOSIS — Z794 Long term (current) use of insulin: Secondary | ICD-10-CM | POA: Diagnosis not present

## 2024-05-30 DIAGNOSIS — I6930 Unspecified sequelae of cerebral infarction: Secondary | ICD-10-CM | POA: Diagnosis not present

## 2024-05-30 DIAGNOSIS — I10 Essential (primary) hypertension: Secondary | ICD-10-CM | POA: Diagnosis not present

## 2024-06-09 DIAGNOSIS — R059 Cough, unspecified: Secondary | ICD-10-CM | POA: Diagnosis not present

## 2024-06-13 DIAGNOSIS — Z743 Need for continuous supervision: Secondary | ICD-10-CM | POA: Diagnosis not present

## 2024-06-13 DIAGNOSIS — D320 Benign neoplasm of cerebral meninges: Secondary | ICD-10-CM | POA: Diagnosis not present

## 2024-06-13 DIAGNOSIS — E785 Hyperlipidemia, unspecified: Secondary | ICD-10-CM | POA: Diagnosis not present

## 2024-06-13 DIAGNOSIS — E119 Type 2 diabetes mellitus without complications: Secondary | ICD-10-CM | POA: Diagnosis not present

## 2024-06-13 DIAGNOSIS — G3183 Dementia with Lewy bodies: Secondary | ICD-10-CM | POA: Diagnosis not present

## 2024-06-13 DIAGNOSIS — I6930 Unspecified sequelae of cerebral infarction: Secondary | ICD-10-CM | POA: Diagnosis not present

## 2024-06-13 DIAGNOSIS — F4489 Other dissociative and conversion disorders: Secondary | ICD-10-CM | POA: Diagnosis not present

## 2024-06-13 DIAGNOSIS — G9389 Other specified disorders of brain: Secondary | ICD-10-CM | POA: Diagnosis not present

## 2024-06-13 DIAGNOSIS — D497 Neoplasm of unspecified behavior of endocrine glands and other parts of nervous system: Secondary | ICD-10-CM | POA: Diagnosis not present

## 2024-06-13 DIAGNOSIS — F29 Unspecified psychosis not due to a substance or known physiological condition: Secondary | ICD-10-CM | POA: Diagnosis not present

## 2024-06-13 DIAGNOSIS — F05 Delirium due to known physiological condition: Secondary | ICD-10-CM | POA: Diagnosis not present

## 2024-06-24 DIAGNOSIS — F3189 Other bipolar disorder: Secondary | ICD-10-CM | POA: Diagnosis not present

## 2024-06-24 DIAGNOSIS — F0392 Unspecified dementia, unspecified severity, with psychotic disturbance: Secondary | ICD-10-CM | POA: Diagnosis not present

## 2024-06-26 DIAGNOSIS — I10 Essential (primary) hypertension: Secondary | ICD-10-CM | POA: Diagnosis not present

## 2024-06-26 DIAGNOSIS — M1990 Unspecified osteoarthritis, unspecified site: Secondary | ICD-10-CM | POA: Diagnosis not present

## 2024-06-26 DIAGNOSIS — E785 Hyperlipidemia, unspecified: Secondary | ICD-10-CM | POA: Diagnosis not present

## 2024-06-26 DIAGNOSIS — E119 Type 2 diabetes mellitus without complications: Secondary | ICD-10-CM | POA: Diagnosis not present

## 2024-06-26 DIAGNOSIS — R419 Unspecified symptoms and signs involving cognitive functions and awareness: Secondary | ICD-10-CM | POA: Diagnosis not present

## 2024-06-26 DIAGNOSIS — Z86718 Personal history of other venous thrombosis and embolism: Secondary | ICD-10-CM | POA: Diagnosis not present

## 2024-06-26 DIAGNOSIS — J45909 Unspecified asthma, uncomplicated: Secondary | ICD-10-CM | POA: Diagnosis not present

## 2024-07-03 DIAGNOSIS — U071 COVID-19: Secondary | ICD-10-CM | POA: Diagnosis not present

## 2024-07-03 DIAGNOSIS — J189 Pneumonia, unspecified organism: Secondary | ICD-10-CM | POA: Diagnosis not present

## 2024-07-03 DIAGNOSIS — I10 Essential (primary) hypertension: Secondary | ICD-10-CM | POA: Diagnosis not present

## 2024-07-03 DIAGNOSIS — R051 Acute cough: Secondary | ICD-10-CM | POA: Diagnosis not present

## 2024-07-03 DIAGNOSIS — E119 Type 2 diabetes mellitus without complications: Secondary | ICD-10-CM | POA: Diagnosis not present

## 2024-07-19 DIAGNOSIS — E782 Mixed hyperlipidemia: Secondary | ICD-10-CM | POA: Diagnosis not present

## 2024-07-19 DIAGNOSIS — Z794 Long term (current) use of insulin: Secondary | ICD-10-CM | POA: Diagnosis not present

## 2024-07-19 DIAGNOSIS — Z0000 Encounter for general adult medical examination without abnormal findings: Secondary | ICD-10-CM | POA: Diagnosis not present

## 2024-07-19 DIAGNOSIS — I10 Essential (primary) hypertension: Secondary | ICD-10-CM | POA: Diagnosis not present

## 2024-07-19 DIAGNOSIS — E1142 Type 2 diabetes mellitus with diabetic polyneuropathy: Secondary | ICD-10-CM | POA: Diagnosis not present

## 2024-07-19 DIAGNOSIS — F03B0 Unspecified dementia, moderate, without behavioral disturbance, psychotic disturbance, mood disturbance, and anxiety: Secondary | ICD-10-CM | POA: Diagnosis not present

## 2024-07-19 DIAGNOSIS — Z7984 Long term (current) use of oral hypoglycemic drugs: Secondary | ICD-10-CM | POA: Diagnosis not present

## 2024-07-19 DIAGNOSIS — F1021 Alcohol dependence, in remission: Secondary | ICD-10-CM | POA: Diagnosis not present

## 2024-07-19 DIAGNOSIS — Z9989 Dependence on other enabling machines and devices: Secondary | ICD-10-CM | POA: Diagnosis not present

## 2024-07-19 DIAGNOSIS — G621 Alcoholic polyneuropathy: Secondary | ICD-10-CM | POA: Diagnosis not present

## 2024-07-19 DIAGNOSIS — H7010 Chronic mastoiditis, unspecified ear: Secondary | ICD-10-CM | POA: Diagnosis not present

## 2024-07-19 DIAGNOSIS — Z7901 Long term (current) use of anticoagulants: Secondary | ICD-10-CM | POA: Diagnosis not present

## 2024-07-19 DIAGNOSIS — F3340 Major depressive disorder, recurrent, in remission, unspecified: Secondary | ICD-10-CM | POA: Diagnosis not present

## 2024-07-24 DIAGNOSIS — E119 Type 2 diabetes mellitus without complications: Secondary | ICD-10-CM | POA: Diagnosis not present

## 2024-07-24 DIAGNOSIS — H35372 Puckering of macula, left eye: Secondary | ICD-10-CM | POA: Diagnosis not present

## 2024-07-24 DIAGNOSIS — H348112 Central retinal vein occlusion, right eye, stable: Secondary | ICD-10-CM | POA: Diagnosis not present

## 2024-07-31 DIAGNOSIS — H90A31 Mixed conductive and sensorineural hearing loss, unilateral, right ear with restricted hearing on the contralateral side: Secondary | ICD-10-CM | POA: Diagnosis not present

## 2024-08-07 DIAGNOSIS — I10 Essential (primary) hypertension: Secondary | ICD-10-CM | POA: Diagnosis not present

## 2024-08-07 DIAGNOSIS — S80212A Abrasion, left knee, initial encounter: Secondary | ICD-10-CM | POA: Diagnosis not present

## 2024-08-07 DIAGNOSIS — S0081XA Abrasion of other part of head, initial encounter: Secondary | ICD-10-CM | POA: Diagnosis not present

## 2024-08-07 DIAGNOSIS — E119 Type 2 diabetes mellitus without complications: Secondary | ICD-10-CM | POA: Diagnosis not present

## 2024-08-07 DIAGNOSIS — E785 Hyperlipidemia, unspecified: Secondary | ICD-10-CM | POA: Diagnosis not present

## 2024-08-07 DIAGNOSIS — I639 Cerebral infarction, unspecified: Secondary | ICD-10-CM | POA: Diagnosis not present

## 2024-08-07 DIAGNOSIS — S0990XA Unspecified injury of head, initial encounter: Secondary | ICD-10-CM | POA: Diagnosis not present

## 2024-08-07 DIAGNOSIS — S8002XA Contusion of left knee, initial encounter: Secondary | ICD-10-CM | POA: Diagnosis not present

## 2024-08-07 DIAGNOSIS — Z79899 Other long term (current) drug therapy: Secondary | ICD-10-CM | POA: Diagnosis not present

## 2024-08-07 DIAGNOSIS — Z794 Long term (current) use of insulin: Secondary | ICD-10-CM | POA: Diagnosis not present

## 2024-08-07 DIAGNOSIS — R4182 Altered mental status, unspecified: Secondary | ICD-10-CM | POA: Diagnosis not present

## 2024-08-07 DIAGNOSIS — Z7901 Long term (current) use of anticoagulants: Secondary | ICD-10-CM | POA: Diagnosis not present

## 2024-08-07 NOTE — ED Provider Notes (Signed)
 "  HISTORY OF PRESENT ILLNESS  Blake Lawson, a 89 y.o. male presents to the ED with a Chief Complaint of Fall   Subjective   Fall  Patient is an 89 year old male with a past medical history of CVA, DVT anticoagulated on Eliquis , hyperlipidemia, and hypertension who presents to the emergency department for evaluation status post fall.  Patient states that last night he was walking through his kitchen when he tripped over his cane and slipped forward striking the left side of his head on the edge of a counter and landing on his left knee on the ground.  He states he was able to get up unassisted go back to bed.  Family members came today and noticed the bump on his head and brought him to the emergency department for further evaluation.  Patient is currently denying any somatic complaints.  He denies loss of consciousness prior to or during the fall.  Denies any antecedent or subsequent lightheadedness, vertigo, palpitations, chest pain, dyspnea, paresthesias, or weakness.   PAST MEDICAL HISTORY REVIEWED   MEDICAL Patient  has a past medical history of CVD (cerebrovascular disease), Deep vein thrombosis (DVT) (HCC), Diabetes mellitus (HCC), Hyperlipidemia, and Hypertension.   SURGICAL Patient  has no past surgical history on file.  FAMILY Patient's family history is not on file.  SOCIAL  reports that he has never smoked. He has never used smokeless tobacco. He reports that he does not drink alcohol  and does not use drugs. PROBLEM LIST Patient does not have a problem list on file.  ALLERGIES Doxycycline and Opioids-meperidine  and related  HOME MEDICATIONS  Patient's Home Meds  Medication Sig   albuterol  90 mcg/Actuation HFA inhaler Take 1 Puff by inhalation Every 4 hours as needed.   amLODIPine  (NORVASC ) 5 mg tablet Take 5 mg by mouth in the morning.   apixaban  (ELIQUIS ) 5 mg tablet Take 5 mg by mouth Two times daily.   atorvastatin  (LIPITOR) 40 mg tablet Take 40 mg by mouth in  the morning.   furosemide (LASIX) 40 mg tablet Take 40 mg by mouth in the morning.   losartan  (COZAAR ) 100 mg tablet Take 100 mg by mouth in the morning.   metoprolol  succinate (TOPROL  XL) 25 mg Extended Release 24 hour tablet Take 25 mg by mouth in the morning.   NOVOLOG  FLEXPEN U-100 INSULIN  100 unit/mL (3 mL) Insulin  Pen Inject 6 Units by subcutaneous injection in the morning.   QUEtiapine (SEROQUEL) 50 mg tablet Take 50 mg by mouth Daily at bedtime.    REVIEW OF SYSTEMS  Review of Systems  Objective  PHYSICAL EXAM  INITIAL VS BP: 158/71 (08/07/24 1054), Pulse: 83 (08/07/24 1054), Resp: 18 (08/07/24 1054), Temp: 98 F (08/07/24 1054), Temp src: Oral (08/07/24 1054), SpO2: 98 % (08/07/24 1054), Height: (not recorded), Weight: 75.6 kg (166 lb 9.6 oz) (08/07/24 1057), BMI (Calculated): (not recorded) No LMP for male patient.  Physical Exam  PRIMARY SURVEY: AIRWAY: Clear. Not obstructed. No intervention performed. BREATHING: Spontaneous, unlabored. No intervention. Breath sounds symmetric. CIRCULATION: Skin warm and dry. No obvious external hemorrhage. Positive carotid, femoral, radial, and dp pulses bilaterally. DISABILITY: Alert with a GCS of 15. Pupils 3 mm bilaterally, reactive and symmetric bilaterally.  SECONDARY SURVEY: HEENT: 2 cm superficial abrasion to left forehead. Normal gaze. Tympanic membranes clear bilaterally. Extraocular motions full bilaterally. Normal jaw occlusion. No skull fracture. No otorrhea, no rhinorrhea, no facial fracture. NECK: No obvious external trauma. Neck veins are flat. No tenderness. Trachea midline with no  crepitus. CHEST: No obvious external trauma. Symmetric movement with no crepitus. No tenderness, heart sounds are S1/S2, regular rate and rhythm. ABDOMEN: No obvious external trauma. No tenderness. No seatbelt sign. Normal contour. No peritonitis. PELVIS: Stable without tenderness. GENITOURINARY: Normal external genitalia. No blood at  the meatus. BACK AND SPINE: Nontender for cervical, thoracic and lumbar spine. No step-off or deformity. No signs of external trauma. VASCULAR EXAMINATION: Pulses: radial 2+, carotid 2+, femoral 2+, posterior tibial 2+, dorsalis pedis 2+ bilaterally NEUROLOGIC EXAMINATION: Intact sensation throughout. Motor examination: Strength 5/5 in all extremities. EXTREMITY EXAMINATION: Superficial abrasion to left anterior knee, otherwise no signs of external trauma to all four extremities. GLASGOW COMA SCALE: [id number] (15)   ED COURSE  Patient initially seen and evaluated at the bedside.  Ordered for CT head.  1350: Patient's head CT is negative for acute intracranial traumatic pathology or skull fracture.  Patient is reevaluated at the bedside and updated on diagnostic study results, he denies somatic complaints at this time.  Discussed expectant management at home with patient and his daughter.  MEDICAL DECISION MAKING AND PLAN OF CARE  Medical Decision Making Amount and/or Complexity of Data Reviewed Radiology: ordered.   I have reviewed previous notes and diagnostic studies where appropriate. Differential diagnosis includes but is not limited to abrasion, contusion, skull fracture, ICH.  Patient has a normal neurologic exam in the emergency department with reassuring neuroimaging without evidence of acute traumatic pathology.  Forehead abrasion is not amenable to repair.  His description of the fall is mechanical in nature without associated symptoms and I have a low suspicion for a central or cardiopulmonary etiology of the fall.  Patient and family are counseled on expectant management with return precautions for worsening headaches, visual changes, nausea, vomiting, or other concerning symptoms.  Patient is counseled on exam findings, diagnostic study results, diagnoses, and outpatient management. Return precautions discussed for worsening symptoms. Discharged in stable  condition.     DIAGNOSTICS      LAB Labs Reviewed - No data to display  RADIOLOGY CT HEAD WO CONTRAST  Radiologist Impression      1.    No acute abnormality was detected.    2.    Small chronic infarction left parietal lobe.    3.    Chronic right frontotemporal craniotomy defect.    4.    Right mastoid disease may be related to postsurgical or post therapeutic changes noted in the  vicinity although an acute component cannot be entirely excluded.               PROCEDURES  Procedures    REEVALUATION    CASE DISCUSSED        MEDICATIONS   Medications - No data to display  Discharge Medication List as of 08/07/2024  1:51 PM     CONTINUE these medications which have NOT CHANGED   Details  albuterol  90 mcg/Actuation HFA inhaler Take 1 Puff by inhalation Every 4 hours as needed.    amLODIPine  (NORVASC ) 5 mg tablet Take 5 mg by mouth in the morning.    apixaban  (ELIQUIS ) 5 mg tablet Take 5 mg by mouth Two times daily.    atorvastatin  (LIPITOR) 40 mg tablet Take 40 mg by mouth in the morning.    furosemide (LASIX) 40 mg tablet Take 40 mg by mouth in the morning.    losartan  (COZAAR ) 100 mg tablet Take 100 mg by mouth in the morning.    metoprolol  succinate (TOPROL  XL) 25  mg Extended Release 24 hour tablet Take 25 mg by mouth in the morning.    NOVOLOG  FLEXPEN U-100 INSULIN  100 unit/mL (3 mL) Insulin  Pen Inject 6 Units by subcutaneous injection in the morning., DAW    QUEtiapine (SEROQUEL) 50 mg tablet Take 50 mg by mouth Daily at bedtime.        LAST VITAL SIGNS Vitals:   08/07/24 1054 08/07/24 1057 08/07/24 1235 08/07/24 1420  BP: 158/71  184/87 173/81  BP Location: right arm  right arm right arm  Patient Position: Sitting  Supine Supine  Pulse: 83  74 72  Resp: 18  16 18   Temp: 98 F  98 F 98 F  TempSrc: Oral  Oral Oral  SpO2: 98%  98% 97%  Weight:  75.6 kg (166 lb 9.6 oz)      CLINICAL IMPRESSION  Final diagnoses:  [S09.90XA]  Closed head injury (Primary)  [S00.81XA] Forehead abrasion  [S80.212A] Abrasion of left knee    DISPOSITION, EDUCATION AND MEDICATION RECONCILIATION  Medications reconciled.  See after visit summary for patient education on discharged patients.       "

## 2024-09-06 DIAGNOSIS — B351 Tinea unguium: Secondary | ICD-10-CM | POA: Diagnosis not present

## 2024-09-06 DIAGNOSIS — M79671 Pain in right foot: Secondary | ICD-10-CM | POA: Diagnosis not present

## 2024-09-06 DIAGNOSIS — E1140 Type 2 diabetes mellitus with diabetic neuropathy, unspecified: Secondary | ICD-10-CM | POA: Diagnosis not present

## 2024-09-06 DIAGNOSIS — M79672 Pain in left foot: Secondary | ICD-10-CM | POA: Diagnosis not present

## 2024-09-06 DIAGNOSIS — L600 Ingrowing nail: Secondary | ICD-10-CM | POA: Diagnosis not present

## 2024-09-18 DIAGNOSIS — I70213 Atherosclerosis of native arteries of extremities with intermittent claudication, bilateral legs: Secondary | ICD-10-CM | POA: Diagnosis not present

## 2024-09-20 DIAGNOSIS — E538 Deficiency of other specified B group vitamins: Secondary | ICD-10-CM | POA: Diagnosis not present

## 2024-09-20 DIAGNOSIS — Z1339 Encounter for screening examination for other mental health and behavioral disorders: Secondary | ICD-10-CM | POA: Diagnosis not present

## 2024-09-20 DIAGNOSIS — Z794 Long term (current) use of insulin: Secondary | ICD-10-CM | POA: Diagnosis not present

## 2024-09-20 DIAGNOSIS — Z7984 Long term (current) use of oral hypoglycemic drugs: Secondary | ICD-10-CM | POA: Diagnosis not present

## 2024-09-20 DIAGNOSIS — I10 Essential (primary) hypertension: Secondary | ICD-10-CM | POA: Diagnosis not present

## 2024-09-20 DIAGNOSIS — E1142 Type 2 diabetes mellitus with diabetic polyneuropathy: Secondary | ICD-10-CM | POA: Diagnosis not present

## 2024-09-20 DIAGNOSIS — Z9989 Dependence on other enabling machines and devices: Secondary | ICD-10-CM | POA: Diagnosis not present

## 2024-10-16 DIAGNOSIS — L600 Ingrowing nail: Secondary | ICD-10-CM | POA: Diagnosis not present

## 2024-10-21 DIAGNOSIS — Z1383 Encounter for screening for respiratory disorder NEC: Secondary | ICD-10-CM | POA: Diagnosis not present

## 2024-11-05 DIAGNOSIS — I272 Pulmonary hypertension, unspecified: Secondary | ICD-10-CM | POA: Diagnosis not present

## 2024-11-05 DIAGNOSIS — F3189 Other bipolar disorder: Secondary | ICD-10-CM | POA: Diagnosis not present

## 2024-11-05 DIAGNOSIS — H348112 Central retinal vein occlusion, right eye, stable: Secondary | ICD-10-CM | POA: Diagnosis not present

## 2024-11-05 DIAGNOSIS — E1169 Type 2 diabetes mellitus with other specified complication: Secondary | ICD-10-CM | POA: Diagnosis not present

## 2024-11-05 DIAGNOSIS — J410 Simple chronic bronchitis: Secondary | ICD-10-CM | POA: Diagnosis not present

## 2024-11-05 DIAGNOSIS — D6869 Other thrombophilia: Secondary | ICD-10-CM | POA: Diagnosis not present

## 2024-11-05 DIAGNOSIS — I470 Re-entry ventricular arrhythmia: Secondary | ICD-10-CM | POA: Diagnosis not present

## 2024-11-05 DIAGNOSIS — D320 Benign neoplasm of cerebral meninges: Secondary | ICD-10-CM | POA: Diagnosis not present

## 2024-11-05 DIAGNOSIS — F1021 Alcohol dependence, in remission: Secondary | ICD-10-CM | POA: Diagnosis not present

## 2024-11-06 DIAGNOSIS — Z86718 Personal history of other venous thrombosis and embolism: Secondary | ICD-10-CM | POA: Diagnosis not present

## 2024-11-06 DIAGNOSIS — Z7901 Long term (current) use of anticoagulants: Secondary | ICD-10-CM | POA: Diagnosis not present

## 2024-11-06 DIAGNOSIS — I739 Peripheral vascular disease, unspecified: Secondary | ICD-10-CM | POA: Diagnosis not present

## 2024-11-06 DIAGNOSIS — I10 Essential (primary) hypertension: Secondary | ICD-10-CM | POA: Diagnosis not present

## 2024-11-06 DIAGNOSIS — E119 Type 2 diabetes mellitus without complications: Secondary | ICD-10-CM | POA: Diagnosis not present

## 2024-11-06 DIAGNOSIS — R002 Palpitations: Secondary | ICD-10-CM | POA: Diagnosis not present

## 2024-11-06 DIAGNOSIS — R001 Bradycardia, unspecified: Secondary | ICD-10-CM | POA: Diagnosis not present

## 2024-12-02 DIAGNOSIS — L600 Ingrowing nail: Secondary | ICD-10-CM | POA: Diagnosis not present
